# Patient Record
Sex: Male | Born: 1970 | Race: Black or African American | Hispanic: No | Marital: Married | Smoking: Current every day smoker
Health system: Southern US, Community
[De-identification: ages and names within clinical notes are randomized; demographics above are authoritative.]

## PROBLEM LIST (undated history)

## (undated) DIAGNOSIS — G4733 Obstructive sleep apnea (adult) (pediatric): Secondary | ICD-10-CM

## (undated) DIAGNOSIS — L930 Discoid lupus erythematosus: Secondary | ICD-10-CM

## (undated) DIAGNOSIS — F419 Anxiety disorder, unspecified: Secondary | ICD-10-CM

## (undated) DIAGNOSIS — I10 Essential (primary) hypertension: Secondary | ICD-10-CM

## (undated) DIAGNOSIS — E785 Hyperlipidemia, unspecified: Secondary | ICD-10-CM

## (undated) HISTORY — PX: JOINT REPLACEMENT: SHX530

---

## 2017-02-23 DIAGNOSIS — E038 Other specified hypothyroidism: Secondary | ICD-10-CM | POA: Insufficient documentation

## 2017-02-23 DIAGNOSIS — E669 Obesity, unspecified: Secondary | ICD-10-CM | POA: Insufficient documentation

## 2017-02-23 DIAGNOSIS — L930 Discoid lupus erythematosus: Secondary | ICD-10-CM | POA: Insufficient documentation

## 2017-02-23 DIAGNOSIS — G4733 Obstructive sleep apnea (adult) (pediatric): Secondary | ICD-10-CM | POA: Diagnosis present

## 2017-02-23 DIAGNOSIS — I10 Essential (primary) hypertension: Secondary | ICD-10-CM | POA: Insufficient documentation

## 2017-02-23 DIAGNOSIS — I129 Hypertensive chronic kidney disease with stage 1 through stage 4 chronic kidney disease, or unspecified chronic kidney disease: Secondary | ICD-10-CM | POA: Insufficient documentation

## 2017-12-08 ENCOUNTER — Emergency Department: Payer: Self-pay

## 2017-12-08 ENCOUNTER — Other Ambulatory Visit: Payer: Self-pay

## 2017-12-08 ENCOUNTER — Encounter: Payer: Self-pay | Admitting: Emergency Medicine

## 2017-12-08 ENCOUNTER — Observation Stay
Admission: EM | Admit: 2017-12-08 | Discharge: 2017-12-09 | Disposition: A | Discharge status: Home / Self Care | Payer: Self-pay | Attending: Internal Medicine | Admitting: Internal Medicine

## 2017-12-08 DIAGNOSIS — Z9119 Patient's noncompliance with other medical treatment and regimen: Secondary | ICD-10-CM | POA: Insufficient documentation

## 2017-12-08 DIAGNOSIS — N183 Chronic kidney disease, stage 3 (moderate): Secondary | ICD-10-CM | POA: Insufficient documentation

## 2017-12-08 DIAGNOSIS — I10 Essential (primary) hypertension: Secondary | ICD-10-CM | POA: Diagnosis present

## 2017-12-08 DIAGNOSIS — F1290 Cannabis use, unspecified, uncomplicated: Secondary | ICD-10-CM | POA: Insufficient documentation

## 2017-12-08 DIAGNOSIS — I161 Hypertensive emergency: Secondary | ICD-10-CM

## 2017-12-08 DIAGNOSIS — M79604 Pain in right leg: Secondary | ICD-10-CM | POA: Insufficient documentation

## 2017-12-08 DIAGNOSIS — E1122 Type 2 diabetes mellitus with diabetic chronic kidney disease: Secondary | ICD-10-CM | POA: Insufficient documentation

## 2017-12-08 DIAGNOSIS — I252 Old myocardial infarction: Secondary | ICD-10-CM | POA: Insufficient documentation

## 2017-12-08 DIAGNOSIS — L930 Discoid lupus erythematosus: Secondary | ICD-10-CM | POA: Insufficient documentation

## 2017-12-08 DIAGNOSIS — E876 Hypokalemia: Secondary | ICD-10-CM | POA: Insufficient documentation

## 2017-12-08 DIAGNOSIS — R609 Edema, unspecified: Secondary | ICD-10-CM

## 2017-12-08 DIAGNOSIS — I1310 Hypertensive heart and chronic kidney disease without heart failure, with stage 1 through stage 4 chronic kidney disease, or unspecified chronic kidney disease: Principal | ICD-10-CM | POA: Insufficient documentation

## 2017-12-08 DIAGNOSIS — Z6841 Body Mass Index (BMI) 40.0 and over, adult: Secondary | ICD-10-CM | POA: Insufficient documentation

## 2017-12-08 DIAGNOSIS — R079 Chest pain, unspecified: Secondary | ICD-10-CM | POA: Diagnosis present

## 2017-12-08 DIAGNOSIS — E785 Hyperlipidemia, unspecified: Secondary | ICD-10-CM | POA: Diagnosis present

## 2017-12-08 DIAGNOSIS — R0789 Other chest pain: Secondary | ICD-10-CM | POA: Insufficient documentation

## 2017-12-08 DIAGNOSIS — M7989 Other specified soft tissue disorders: Secondary | ICD-10-CM | POA: Insufficient documentation

## 2017-12-08 DIAGNOSIS — F17210 Nicotine dependence, cigarettes, uncomplicated: Secondary | ICD-10-CM | POA: Insufficient documentation

## 2017-12-08 DIAGNOSIS — F419 Anxiety disorder, unspecified: Secondary | ICD-10-CM | POA: Diagnosis present

## 2017-12-08 DIAGNOSIS — G4733 Obstructive sleep apnea (adult) (pediatric): Secondary | ICD-10-CM | POA: Diagnosis present

## 2017-12-08 HISTORY — DX: Discoid lupus erythematosus: L93.0

## 2017-12-08 HISTORY — DX: Obstructive sleep apnea (adult) (pediatric): G47.33

## 2017-12-08 HISTORY — DX: Hyperlipidemia, unspecified: E78.5

## 2017-12-08 HISTORY — DX: Essential (primary) hypertension: I10

## 2017-12-08 HISTORY — DX: Anxiety disorder, unspecified: F41.9

## 2017-12-08 LAB — COMPREHENSIVE METABOLIC PANEL
ALBUMIN: 3.8 g/dL (ref 3.5–5.0)
ALT: 20 U/L (ref 17–63)
ANION GAP: 7 (ref 5–15)
AST: 33 U/L (ref 15–41)
Alkaline Phosphatase: 52 U/L (ref 38–126)
BUN: 24 mg/dL — AB (ref 6–20)
CHLORIDE: 105 mmol/L (ref 101–111)
CO2: 25 mmol/L (ref 22–32)
Calcium: 9 mg/dL (ref 8.9–10.3)
Creatinine, Ser: 1.95 mg/dL — ABNORMAL HIGH (ref 0.61–1.24)
GFR calc Af Amer: 45 mL/min — ABNORMAL LOW (ref >60)
GFR, EST NON AFRICAN AMERICAN: 39 mL/min — AB (ref >60)
Glucose, Bld: 84 mg/dL (ref 65–99)
POTASSIUM: 4.4 mmol/L (ref 3.5–5.1)
Sodium: 137 mmol/L (ref 135–145)
Total Bilirubin: 0.9 mg/dL (ref 0.3–1.2)
Total Protein: 7.3 g/dL (ref 6.5–8.1)

## 2017-12-08 LAB — TROPONIN I
TROPONIN I: 0.03 ng/mL — AB (ref <0.03)
TROPONIN I: 0.03 ng/mL — AB (ref <0.03)

## 2017-12-08 LAB — BRAIN NATRIURETIC PEPTIDE: B Natriuretic Peptide: 93 pg/mL (ref 0.0–100.0)

## 2017-12-08 LAB — CBC
HCT: 45.1 % (ref 40.0–52.0)
Hemoglobin: 15.1 g/dL (ref 13.0–18.0)
MCH: 32.7 pg (ref 26.0–34.0)
MCHC: 33.3 g/dL (ref 32.0–36.0)
MCV: 98.1 fL (ref 80.0–100.0)
PLATELETS: 136 10*3/uL — AB (ref 150–440)
RBC: 4.6 MIL/uL (ref 4.40–5.90)
RDW: 18.2 % — ABNORMAL HIGH (ref 11.5–14.5)
WBC: 5.4 10*3/uL (ref 3.8–10.6)

## 2017-12-08 MED ORDER — CLONIDINE HCL 0.1 MG PO TABS
ORAL_TABLET | ORAL | Status: AC
  Administered 2017-12-08: 0.2 mg via ORAL
  Filled 2017-12-08: qty 2

## 2017-12-08 MED ORDER — HYDRALAZINE HCL 20 MG/ML IJ SOLN
5.0000 mg | Freq: Once | INTRAMUSCULAR | Status: AC
  Administered 2017-12-08: 5 mg via INTRAVENOUS
  Filled 2017-12-08: qty 1

## 2017-12-08 MED ORDER — HYDRALAZINE HCL 20 MG/ML IJ SOLN
5.0000 mg | Freq: Once | INTRAMUSCULAR | Status: AC
  Administered 2017-12-08: 5 mg via INTRAVENOUS

## 2017-12-08 MED ORDER — HYDRALAZINE HCL 20 MG/ML IJ SOLN: 5.0000 mg | Freq: Once | INTRAMUSCULAR | Status: DC

## 2017-12-08 MED ORDER — CLONIDINE HCL 0.1 MG PO TABS
0.2000 mg | ORAL_TABLET | Freq: Once | ORAL | Status: AC
  Administered 2017-12-08: 0.2 mg via ORAL

## 2017-12-08 MED ORDER — ASPIRIN 81 MG PO CHEW
324.0000 mg | CHEWABLE_TABLET | Freq: Once | ORAL | Status: AC
  Administered 2017-12-08: 324 mg via ORAL
  Filled 2017-12-08: qty 4

## 2017-12-08 NOTE — ED Notes (Signed)
Charge nurse notified of pt's troponin 

## 2017-12-08 NOTE — H&P (Addendum)
[facility] [place] [facility]   PATIENT NAME: Oscar Jones    MR#:  [id number]  DATE OF BIRTH:  1971-07-08  DATE OF ADMISSION:  12/08/2017  PRIMARY CARE PHYSICIAN: Patient, No Pcp Per   REQUESTING/REFERRING PHYSICIAN: Fanny BienQuale, MD  CHIEF COMPLAINT:   Chief Complaint  Patient presents with  . Chest Pain    HISTORY OF PRESENT ILLNESS:  Oscar Abbotndre Ruppe  is a 47 y.o. male who presents with chest pain and shortness of breath.  Patient states that he recently moved here from [place].  In [place] he was on a number of medications for blood pressure and some cardiac meds.  He states that he had a workup by cardiologist up there is some months ago and was given medications at that time.  However, he has not been able to get the medicine since he has been down here.  He presented to the ED tonight with shortness of breath and a blood pressure in the 200s over 120s.  His blood pressure did respond to medications initially, but given the chest pain and his risk factors hospitalist were called for admission and further evaluation  PAST MEDICAL HISTORY:   Past Medical History:  Diagnosis Date  . Anxiety   . Discoid lupus   . HLD (hyperlipidemia)   . Hypertension   . OSA (obstructive sleep apnea)     PAST SURGICAL HISTORY:   Past Surgical History:  Procedure Laterality Date  . JOINT REPLACEMENT      SOCIAL HISTORY:   Social History   Tobacco Use  . Smoking status: Current Every Day Smoker    Packs/day: 1.00    Types: Cigarettes  Substance Use Topics  . Alcohol use: Yes    Alcohol/week: 3.6 oz    Types: 6 Cans of beer per week    FAMILY HISTORY:  No family history on file.  DRUG ALLERGIES:  No Known Allergies  MEDICATIONS AT HOME:   Prior to Admission medications   Medication Sig Start Date End Date Taking? Authorizing Provider  acetaminophen (TYLENOL) 325 MG tablet Take 975 mg by mouth 2 (two) times daily.    Yes [provider]   naproxen sodium (ALEVE) 220 MG tablet Take 660 mg by mouth 2 (two) times daily.    Yes [provider]    REVIEW OF SYSTEMS:  Review of Systems  Constitutional: Negative for chills, fever, malaise/fatigue and weight loss.  HENT: Negative for ear pain, hearing loss and tinnitus.   Eyes: Negative for blurred vision, double vision, pain and redness.  Respiratory: Positive for shortness of breath. Negative for cough and hemoptysis.   Cardiovascular: Positive for chest pain and leg swelling. Negative for palpitations and orthopnea.  Gastrointestinal: Negative for abdominal pain, constipation, diarrhea, nausea and vomiting.  Genitourinary: Negative for dysuria, frequency and hematuria.  Musculoskeletal: Negative for back pain, joint pain and neck pain.  Skin:       No acne, rash, or lesions  Neurological: Negative for dizziness, tremors, focal weakness and weakness.  Endo/Heme/Allergies: Negative for polydipsia. Does not bruise/bleed easily.  Psychiatric/Behavioral: Negative for depression. The patient is not nervous/anxious and does not have insomnia.      VITAL SIGNS:   Vitals:   12/08/17 2045 12/08/17 2148 12/08/17 2202 12/08/17 2245  BP: (!) 179/108 (!) 157/96 (!) 174/113 (!) 155/99  Pulse:    68  Resp: 15  18 17   Temp:      TempSrc:  SpO2:   97% 97%  Weight:      Height:       Wt Readings from Last 3 Encounters:  12/08/17 (!) 172.4 kg (380 lb)    PHYSICAL EXAMINATION:  Physical Exam  Vitals reviewed. Constitutional: He is oriented to person, place, and time. He appears well-developed and well-nourished. No distress.  HENT:  Head: Normocephalic and atraumatic.  Mouth/Throat: Oropharynx is clear and moist.  Eyes: Conjunctivae and EOM are normal. Pupils are equal, round, and reactive to light. No scleral icterus.  Neck: Normal range of motion. Neck supple. No JVD present. No thyromegaly present.  Cardiovascular: Normal rate, regular rhythm and intact distal  pulses. Exam reveals no gallop and no friction rub.  No murmur heard. Respiratory: Effort normal and breath sounds normal. No respiratory distress. He has no wheezes. He has no rales.  GI: Soft. Bowel sounds are normal. He exhibits no distension. There is no tenderness.  Musculoskeletal: Normal range of motion. He exhibits edema.  No arthritis, no gout  Lymphadenopathy:    He has no cervical adenopathy.  Neurological: He is alert and oriented to person, place, and time. No cranial nerve deficit.  No dysarthria, no aphasia  Skin: Skin is warm and dry. No rash noted. No erythema.  Psychiatric: He has a normal mood and affect. His behavior is normal. Judgment and thought content normal.    LABORATORY PANEL:   CBC Recent Labs  Lab 12/08/17 1715  WBC 5.4  HGB 15.1  HCT 45.1  PLT 136*   ------------------------------------------------------------------------------------------------------------------  Chemistries  Recent Labs  Lab 12/08/17 1715  NA 137  K 4.4  CL 105  CO2 25  GLUCOSE 84  BUN 24*  CREATININE 1.95*  CALCIUM 9.0  AST 33  ALT 20  ALKPHOS 52  BILITOT 0.9   ------------------------------------------------------------------------------------------------------------------  Cardiac Enzymes Recent Labs  Lab 12/08/17 2047  TROPONINI 0.03*   ------------------------------------------------------------------------------------------------------------------  RADIOLOGY:  Dg Chest 2 View  Result Date: 12/08/2017 CLINICAL DATA:  Intermittent chest pain for 2 weeks. EXAM: CHEST  2 VIEW COMPARISON:  None. FINDINGS: The mediastinal contour is normal and heart size is enlarged. Aorta is tortuous. Biapical scarring are noted. Mild patchy opacity of right lung base is identified. There is no pulmonary edema or pleural effusion. The visualized skeletal structures are unremarkable. IMPRESSION: Mild patchy opacity of right lung base which could reflect atelectasis or early  pneumonia. Electronically Signed   By: Sherian Rein M.D.   On: 12/08/2017 20:18    EKG:   Orders placed or performed during the hospital encounter of 12/08/17  . EKG 12-Lead  . EKG 12-Lead  . ED EKG within 10 minutes  . ED EKG within 10 minutes    IMPRESSION AND PLAN:  Principal Problem:   Accelerated hypertension -patient responded to IV antihypertensives.  We will admit him with monitoring continued blood pressure control with a goal less than 160/100. Active Problems:   Chest pain -cycle cardiac enzymes, get an echocardiogram and cardiology consult.  Patient also complains of pain in his right leg and swelling in that leg more so than on the left.  We will ultrasound his leg to evaluate for DVT   OSA treated with BiPAP -BiPAP nightly   HLD (hyperlipidemia) -we will check his lipids, he will likely need to be started on a statin.  Of note, patient states he is not connected with any physicians in this area.  He would benefit from being connected to primary care physician in  addition to his cardiology consult above.  All the records are reviewed and case discussed with ED provider. Management plans discussed with the patient and/or family.  DVT PROPHYLAXIS: SubQ lovenox  GI PROPHYLAXIS: None  ADMISSION STATUS: Observation  CODE STATUS: Full Code Status History    This patient does not have a recorded code status. Please follow your organizational policy for patients in this situation.      TOTAL TIME TAKING CARE OF THIS PATIENT: 40 minutes.   Mamta Rimmer FIELDING 12/08/2017, 11:50 PM  [facility]  Office  [phone]  CC: Primary care physician; Patient, No Pcp Per  Note:  This document was prepared using Dragon voice recognition software and may include unintentional dictation errors.

## 2017-12-08 NOTE — ED Provider Notes (Signed)
[facility] Emergency Department Provider Note   ____________________________________________   First MD Initiated Contact with Patient 12/08/17 1938     (approximate)  I have reviewed the triage vital signs and the nursing notes.   HISTORY  Chief Complaint Chest Pain   HPI Oscar Jones is a 47 y.o. male presents for evaluation of chest discomfort and shortness of breath  Patient reports that he has been off of his blood pressure medicine for about 1-2 months now at least, reports he has a history of very high blood pressure.  He is noticed that his legs have become swollen, he is feeling short of breath when he walks or exerts himself, he is also having intermittent discomfort in the middle of his chest which she describes as a slight heaviness.  Reports that this has happened to him before and was treated in [place] and thought due to his severe high blood pressure for which she is supposed to be taking several medications which he cannot recall the names of except thinks he may have been taking clonidine.  Also reports is a history of chronic kidney disease this  Reports that he needs to get back on his blood pressure medicine.  Currently reports his chest discomfort is improved slightly after taking blood pressure medicine when he arrived to the ER  Past Medical History:  Diagnosis Date  . Discoid lupus   . Hypertension     There are no active problems to display for this patient.   History reviewed. No pertinent surgical history.  Prior to Admission medications   Not on File    Allergies Patient has no known allergies.  No family history on file.  Social History Social History   Tobacco Use  . Smoking status: Current Every Day Smoker    Packs/day: 1.00    Types: Cigarettes  Substance Use Topics  . Alcohol use: Not on file  . Drug use: Not on file    Review of Systems Constitutional: No fever/chills Eyes: No visual  changes. ENT: No sore throat. Cardiovascular: See HPI respiratory: Mild shortness of breath with walking for about the last 2 weeks, is worse when he lays down at night.  He denies productive cough. Gastrointestinal: No abdominal pain.  No nausea, no vomiting.  No diarrhea.  No constipation. Genitourinary: Negative for dysuria. Musculoskeletal: Negative for back pain.  For the last month or 2 some swelling in his lower legs bilateral Skin: Negative for rash. Neurological: Negative for headaches, focal weakness or numbness.    ____________________________________________   PHYSICAL EXAM:  VITAL SIGNS: ED Triage Vitals  Enc Vitals Group     BP 12/08/17 1713 (!) 216/126     Pulse Rate 12/08/17 1713 69     Resp 12/08/17 1713 20     Temp 12/08/17 1713 97.9 F (36.6 C)     Temp Source 12/08/17 1713 Oral     SpO2 12/08/17 1713 98 %     Weight 12/08/17 1717 (!) 380 lb (172.4 kg)     Height 12/08/17 1717 6' (1.829 m)     Head Circumference --      Peak Flow --      Pain Score --      Pain Loc --      Pain Edu? --      Excl. in GC? --     Constitutional: Alert and oriented. Well appearing and in no acute distress. Eyes: Conjunctivae are normal. Head: Atraumatic. Nose: No  congestion/rhinnorhea. Mouth/Throat: Mucous membranes are moist. Neck: No stridor.   Cardiovascular: Normal rate, regular rhythm. Grossly normal heart sounds.  Good peripheral circulation.  Mild JVD is noted. Respiratory: Normal respiratory effort.  No retractions. Lungs CTAB. Gastrointestinal: Soft and nontender. No distention. Musculoskeletal: Patient has 2+ pitting edema in the lower extremities bilateral.  Full range of motion of all extremities without pain or discomfort. Neurologic:  Normal speech and language. No gross focal neurologic deficits are appreciated.  Skin:  Skin is warm, dry and intact. No rash noted. Psychiatric: Mood and affect are normal. Speech and behavior are  normal.  ____________________________________________   LABS (all labs ordered are listed, but only abnormal results are displayed)  Labs Reviewed  CBC - Abnormal; Notable for the following components:      Result Value   RDW 18.2 (*)    Platelets 136 (*)    All other components within normal limits  TROPONIN I - Abnormal; Notable for the following components:   Troponin I 0.03 (*)    All other components within normal limits  COMPREHENSIVE METABOLIC PANEL - Abnormal; Notable for the following components:   BUN 24 (*)    Creatinine, Ser 1.95 (*)    GFR calc non Af Amer 39 (*)    GFR calc Af Amer 45 (*)    All other components within normal limits  TROPONIN I - Abnormal; Notable for the following components:   Troponin I 0.03 (*)    All other components within normal limits  BRAIN NATRIURETIC PEPTIDE   ____________________________________________  EKG  Reviewed interrupt me at 1715 Ventricular rate 75 QRS 75 QTC 470 Normal sinus rhythm, a very minimal nonspecific T wave abnormal V5 and V6.  LVH.  No obvious ischemic changes are noted ____________________________________________  RADIOLOGY  Dg Chest 2 View  Result Date: 12/08/2017 CLINICAL DATA:  Intermittent chest pain for 2 weeks. EXAM: CHEST  2 VIEW COMPARISON:  None. FINDINGS: The mediastinal contour is normal and heart size is enlarged. Aorta is tortuous. Biapical scarring are noted. Mild patchy opacity of right lung base is identified. There is no pulmonary edema or pleural effusion. The visualized skeletal structures are unremarkable. IMPRESSION: Mild patchy opacity of right lung base which could reflect atelectasis or early pneumonia. Electronically Signed   By: Sherian Rein M.D.   On: 12/08/2017 20:18    Chest x-ray reviewed, slight lung base opacity reviewed by the radiologist.  Clinically, denies fevers or chills or systemic symptoms suggest infection.  I suspect this is likely atelectasis and unlikely to  represent pneumonia given he has no fevers or chills, denies associated cough or right-sided chest discomfort.  ____________________________________________   PROCEDURES  Procedure(s) performed: None  Procedures  Critical Care performed: No  ____________________________________________   INITIAL IMPRESSION / ASSESSMENT AND PLAN / ED COURSE  Pertinent labs & imaging results that were available during my care of the patient were reviewed by me and considered in my medical decision making (see chart for details).       ----------------------------------------- 9:44 PM on 12/08/2017 -----------------------------------------  Blood pressure is up to 118/119 now, despite hydralazine and clonidine.  Did improve briefly with hydralazine.  I will give a second dose of hydralazine at this time, patient reports his chest discomfort has improved, but he obviously has severe untreated hypertension now with elevation of his troponin and I suspect likely some worsening of his renal disease concerning for hypertensive urgency/borderline hypertensive emergency today.  Patient agreeable with plan for admission.  Patient will be admitted, repeat troponin remains minimally elevated.  No significant change however.  Reports symptoms of chest pain are improving.  No associated neurologic symptoms. ____________________________________________   FINAL CLINICAL IMPRESSION(S) / ED DIAGNOSES  Final diagnoses:  Hypertensive emergency without congestive heart failure      NEW MEDICATIONS STARTED DURING THIS VISIT:  This SmartLink is deprecated. Use AVSMEDLIST instead to display the medication list for a patient.   Note:  This document was prepared using Dragon voice recognition software and may include unintentional dictation errors.     Sharyn Creamer, MD 12/08/17 2146

## 2017-12-08 NOTE — ED Triage Notes (Addendum)
Intermittent chest pain x 2 weeks. Associated with SOB. Does not increase with inspiration. Regarding blood pressure, states has history of hypertension and has not taken meds x 2 weeks.

## 2017-12-08 NOTE — ED Notes (Signed)
Pt sleeping.  Respirations even and non-labored.

## 2017-12-09 ENCOUNTER — Observation Stay (HOSPITAL_BASED_OUTPATIENT_CLINIC_OR_DEPARTMENT_OTHER)
Admit: 2017-12-09 | Discharge: 2017-12-09 | Disposition: A | Discharge status: Home / Self Care | Payer: Self-pay | Attending: Internal Medicine | Admitting: Internal Medicine

## 2017-12-09 ENCOUNTER — Other Ambulatory Visit: Payer: Self-pay

## 2017-12-09 ENCOUNTER — Observation Stay (HOSPITAL_BASED_OUTPATIENT_CLINIC_OR_DEPARTMENT_OTHER): Payer: Self-pay

## 2017-12-09 ENCOUNTER — Observation Stay: Payer: Self-pay

## 2017-12-09 ENCOUNTER — Encounter: Payer: Self-pay | Admitting: Emergency Medicine

## 2017-12-09 DIAGNOSIS — I119 Hypertensive heart disease without heart failure: Secondary | ICD-10-CM

## 2017-12-09 DIAGNOSIS — I10 Essential (primary) hypertension: Secondary | ICD-10-CM

## 2017-12-09 DIAGNOSIS — I340 Nonrheumatic mitral (valve) insufficiency: Secondary | ICD-10-CM

## 2017-12-09 DIAGNOSIS — R079 Chest pain, unspecified: Secondary | ICD-10-CM

## 2017-12-09 DIAGNOSIS — G4733 Obstructive sleep apnea (adult) (pediatric): Secondary | ICD-10-CM

## 2017-12-09 LAB — LIPID PANEL
CHOL/HDL RATIO: 4.4 ratio
CHOLESTEROL: 304 mg/dL — AB (ref 0–200)
HDL: 69 mg/dL (ref >40)
LDL Cholesterol: 196 mg/dL — ABNORMAL HIGH (ref 0–99)
TRIGLYCERIDES: 196 mg/dL — AB (ref <150)
VLDL: 39 mg/dL (ref 0–40)

## 2017-12-09 LAB — CBC
HEMATOCRIT: 43.3 % (ref 40.0–52.0)
HEMOGLOBIN: 14.7 g/dL (ref 13.0–18.0)
MCH: 33.1 pg (ref 26.0–34.0)
MCHC: 34 g/dL (ref 32.0–36.0)
MCV: 97.2 fL (ref 80.0–100.0)
Platelets: 141 10*3/uL — ABNORMAL LOW (ref 150–440)
RBC: 4.45 MIL/uL (ref 4.40–5.90)
RDW: 18 % — ABNORMAL HIGH (ref 11.5–14.5)
WBC: 4.6 10*3/uL (ref 3.8–10.6)

## 2017-12-09 LAB — NM MYOCAR MULTI W/SPECT W/WALL MOTION / EF
Peak HR: 81 {beats}/min
Percent HR: 47 %
Rest HR: 50 {beats}/min

## 2017-12-09 LAB — MAGNESIUM: MAGNESIUM: 1.9 mg/dL (ref 1.7–2.4)

## 2017-12-09 LAB — BASIC METABOLIC PANEL
ANION GAP: 8 (ref 5–15)
BUN: 22 mg/dL — ABNORMAL HIGH (ref 6–20)
CHLORIDE: 106 mmol/L (ref 101–111)
CO2: 24 mmol/L (ref 22–32)
Calcium: 8.9 mg/dL (ref 8.9–10.3)
Creatinine, Ser: 1.86 mg/dL — ABNORMAL HIGH (ref 0.61–1.24)
GFR calc non Af Amer: 41 mL/min — ABNORMAL LOW (ref >60)
GFR, EST AFRICAN AMERICAN: 48 mL/min — AB (ref >60)
Glucose, Bld: 116 mg/dL — ABNORMAL HIGH (ref 65–99)
POTASSIUM: 3.3 mmol/L — AB (ref 3.5–5.1)
SODIUM: 138 mmol/L (ref 135–145)

## 2017-12-09 LAB — TROPONIN I
Troponin I: <0.03 ng/mL (ref <0.03)
Troponin I: 0.03 ng/mL (ref <0.03)
Troponin I: 0.03 ng/mL (ref <0.03)

## 2017-12-09 LAB — ECHOCARDIOGRAM COMPLETE
Height: 72 in
Weight: 5209.6 oz

## 2017-12-09 MED ORDER — LABETALOL HCL 5 MG/ML IV SOLN: 10.0000 mg | INTRAVENOUS | Status: DC | PRN

## 2017-12-09 MED ORDER — AMLODIPINE BESYLATE 5 MG PO TABS
5.0000 mg | ORAL_TABLET | Freq: Every day | ORAL | Status: DC
  Administered 2017-12-09: 5 mg via ORAL
  Filled 2017-12-09: qty 1

## 2017-12-09 MED ORDER — ONDANSETRON HCL 4 MG PO TABS: 4.0000 mg | ORAL_TABLET | Freq: Four times a day (QID) | ORAL | Status: DC | PRN

## 2017-12-09 MED ORDER — ISOSORBIDE MONONITRATE ER 30 MG PO TB24
30.0000 mg | ORAL_TABLET | Freq: Every day | ORAL | Status: DC
  Administered 2017-12-09: 30 mg via ORAL
  Filled 2017-12-09: qty 1

## 2017-12-09 MED ORDER — HYDRALAZINE HCL 50 MG PO TABS
50.0000 mg | ORAL_TABLET | Freq: Three times a day (TID) | ORAL | Status: DC
  Administered 2017-12-09 (×2): 50 mg via ORAL
  Filled 2017-12-09 (×2): qty 1

## 2017-12-09 MED ORDER — TRAMADOL HCL 50 MG PO TABS
50.0000 mg | ORAL_TABLET | Freq: Four times a day (QID) | ORAL | Status: DC | PRN
  Administered 2017-12-09 (×3): 50 mg via ORAL
  Filled 2017-12-09 (×3): qty 1

## 2017-12-09 MED ORDER — HYDRALAZINE HCL 50 MG PO TABS: 50.0000 mg | ORAL_TABLET | Freq: Three times a day (TID) | ORAL | 1 refills | Status: DC

## 2017-12-09 MED ORDER — REGADENOSON 0.4 MG/5ML IV SOLN
0.4000 mg | Freq: Once | INTRAVENOUS | Status: AC
  Administered 2017-12-09: 0.4 mg via INTRAVENOUS

## 2017-12-09 MED ORDER — TECHNETIUM TC 99M TETROFOSMIN IV KIT
30.0000 | PACK | Freq: Once | INTRAVENOUS | Status: AC | PRN
  Administered 2017-12-09: 32.67 via INTRAVENOUS

## 2017-12-09 MED ORDER — ACETAMINOPHEN 650 MG RE SUPP: 650.0000 mg | Freq: Four times a day (QID) | RECTAL | Status: DC | PRN

## 2017-12-09 MED ORDER — NITROGLYCERIN 0.4 MG SL SUBL: 0.4000 mg | SUBLINGUAL_TABLET | SUBLINGUAL | Status: DC | PRN

## 2017-12-09 MED ORDER — ISOSORBIDE MONONITRATE ER 30 MG PO TB24: 30.0000 mg | ORAL_TABLET | Freq: Every day | ORAL | 1 refills | Status: DC

## 2017-12-09 MED ORDER — ACETAMINOPHEN 325 MG PO TABS
650.0000 mg | ORAL_TABLET | Freq: Four times a day (QID) | ORAL | Status: DC | PRN
  Administered 2017-12-09: 650 mg via ORAL
  Filled 2017-12-09: qty 2

## 2017-12-09 MED ORDER — POTASSIUM CHLORIDE CRYS ER 20 MEQ PO TBCR
40.0000 meq | EXTENDED_RELEASE_TABLET | Freq: Once | ORAL | Status: AC
  Administered 2017-12-09: 40 meq via ORAL
  Filled 2017-12-09: qty 2

## 2017-12-09 MED ORDER — ATORVASTATIN CALCIUM 20 MG PO TABS
40.0000 mg | ORAL_TABLET | Freq: Every day | ORAL | Status: DC
  Administered 2017-12-09: 40 mg via ORAL
  Filled 2017-12-09: qty 2

## 2017-12-09 MED ORDER — ONDANSETRON HCL 4 MG/2ML IJ SOLN: 4.0000 mg | Freq: Four times a day (QID) | INTRAMUSCULAR | Status: DC | PRN

## 2017-12-09 MED ORDER — HYDRALAZINE HCL 20 MG/ML IJ SOLN
10.0000 mg | INTRAMUSCULAR | Status: DC | PRN
  Administered 2017-12-09: 10 mg via INTRAVENOUS
  Filled 2017-12-09: qty 1

## 2017-12-09 MED ORDER — ATORVASTATIN CALCIUM 40 MG PO TABS: 40.0000 mg | ORAL_TABLET | Freq: Every day | ORAL | 1 refills | Status: DC

## 2017-12-09 MED ORDER — AMLODIPINE BESYLATE 5 MG PO TABS: 5.0000 mg | ORAL_TABLET | Freq: Every day | ORAL | 1 refills | Status: DC

## 2017-12-09 MED ORDER — HEPARIN SODIUM (PORCINE) 5000 UNIT/ML IJ SOLN
5000.0000 [IU] | Freq: Three times a day (TID) | INTRAMUSCULAR | Status: DC
  Administered 2017-12-09 (×3): 5000 [IU] via SUBCUTANEOUS
  Filled 2017-12-09 (×3): qty 1

## 2017-12-09 NOTE — Progress Notes (Signed)
*  PRELIMINARY RESULTS* Echocardiogram 2D Echocardiogram has been performed.  Cristela BlueHege, Nicolaus Andel 12/09/2017, 8:41 AM

## 2017-12-09 NOTE — Progress Notes (Signed)
CRITICAL VALUE ALERT  Critical Value:  Troponin 0.03  Date & Time Notied:  12/09/2017 0758  Provider Notified: Cardio-Ryan Dunn   Orders Received/Actions taken: No new orders,MD aware

## 2017-12-09 NOTE — ED Notes (Signed)
Patient transported to 250 

## 2017-12-09 NOTE — Discharge Summary (Addendum)
[facility] - [place] at [facility]   PATIENT NAME: Oscar Jones    MR#:  [id number]  DATE OF BIRTH:  07/27/1971  DATE OF ADMISSION:  12/08/2017   ADMITTING PHYSICIAN: Oralia Manis, MD  DATE OF DISCHARGE: 12/09/2017 PRIMARY CARE PHYSICIAN: Patient, No Pcp Per   ADMISSION DIAGNOSIS:  Hypertensive emergency without congestive heart failure [I16.1] Moderate risk chest pain [R07.9] DISCHARGE DIAGNOSIS:  Principal Problem:   Accelerated hypertension Active Problems:   Chest pain   OSA treated with BiPAP   Anxiety   HLD (hyperlipidemia)  SECONDARY DIAGNOSIS:   Past Medical History:  Diagnosis Date  . Anxiety   . Discoid lupus   . HLD (hyperlipidemia)   . Hypertension   . OSA (obstructive sleep apnea)    HOSPITAL COURSE:  Accelerated hypertension, improved. Continue Norvasc, hydralazine and added imdur, IV hydralazine as needed.  Chest pain Troponin is normal, echocardiogram show normal ejection fraction LV EF: 55% - 60%. Venous duplex did not show any DVT.  Normal stress test per Dr. Mariah Milling.  Hypokalemia.  Potassium supplement.  Magnesium is normal.  Morbid obesity and OSA. On BiPAP nightly HLD (hyperlipidemia). LDL 196.  Started Lipitor. CKD stage III.  Stable.  Tobacco abuse.  Smoking cessation was counseled for 4 minutes, nicotine patch. DISCHARGE CONDITIONS:  Stable, discharged to home today. CONSULTS OBTAINED:  Treatment Team:  Antonieta Iba, MD DRUG ALLERGIES:  No Known Allergies DISCHARGE MEDICATIONS:   Allergies as of 12/09/2017   No Known Allergies     Medication List    STOP taking these medications   naproxen sodium 220 MG tablet Commonly known as:  ALEVE     TAKE these medications   acetaminophen 325 MG tablet Commonly known as:  TYLENOL Take 975 mg by mouth 2 (two) times daily.   amLODipine 5 MG tablet Commonly known as:  NORVASC Take 1 tablet (5 mg total) by mouth daily. Start taking on:  12/10/2017     atorvastatin 40 MG tablet Commonly known as:  LIPITOR Take 1 tablet (40 mg total) by mouth daily at 6 PM.   hydrALAZINE 50 MG tablet Commonly known as:  APRESOLINE Take 1 tablet (50 mg total) by mouth every 8 (eight) hours.   isosorbide mononitrate 30 MG 24 hr tablet Commonly known as:  IMDUR Take 1 tablet (30 mg total) by mouth daily. Start taking on:  12/10/2017        DISCHARGE INSTRUCTIONS:  See AVS. If you experience worsening of your admission symptoms, develop shortness of breath, life threatening emergency, suicidal or homicidal thoughts you must seek medical attention immediately by calling 911 or calling your MD immediately  if symptoms less severe.  You Must read complete instructions/literature along with all the possible adverse reactions/side effects for all the Medicines you take and that have been prescribed to you. Take any new Medicines after you have completely understood and accpet all the possible adverse reactions/side effects.   Please note  You were cared for by a hospitalist during your hospital stay. If you have any questions about your discharge medications or the care you received while you were in the hospital after you are discharged, you can call the unit and asked to speak with the hospitalist on call if the hospitalist that took care of you is not available. Once you are discharged, your primary care physician will handle any further medical issues. Please note that NO REFILLS for any discharge medications will be authorized once you  are discharged, as it is imperative that you return to your primary care physician (or establish a relationship with a primary care physician if you do not have one) for your aftercare needs so that they can reassess your need for medications and monitor your lab values.    On the day of Discharge:  VITAL SIGNS:  Blood pressure 133/71, pulse (!) 51, temperature (!) 97.5 F (36.4 C), temperature source Oral, resp. rate 18,  height 6' (1.829 m), weight (!) 325 lb 9.6 oz (147.7 kg), SpO2 97 %. PHYSICAL EXAMINATION:  GENERAL:  47 y.o.-year-old patient lying in the bed with no acute distress.  Morbid obesity. EYES: Pupils equal, round, reactive to light and accommodation. No scleral icterus. Extraocular muscles intact.  HEENT: Head atraumatic, normocephalic. Oropharynx and nasopharynx clear.  NECK:  Supple, no jugular venous distention. No thyroid enlargement, no tenderness.  LUNGS: Normal breath sounds bilaterally, no wheezing, rales,rhonchi or crepitation. No use of accessory muscles of respiration.  CARDIOVASCULAR: S1, S2 normal. No murmurs, rubs, or gallops.  ABDOMEN: Soft, non-tender, non-distended. Bowel sounds present. No organomegaly or mass.  EXTREMITIES: No pedal edema, cyanosis, or clubbing.  NEUROLOGIC: Cranial nerves II through XII are intact. Muscle strength 5/5 in all extremities. Sensation intact. Gait not checked.  PSYCHIATRIC: The patient is alert and oriented x 3.  SKIN: No obvious rash, lesion, or ulcer.  DATA REVIEW:   CBC Recent Labs  Lab 12/09/17 0225  WBC 4.6  HGB 14.7  HCT 43.3  PLT 141*    Chemistries  Recent Labs  Lab 12/08/17 1715 12/09/17 0225 12/09/17 1233  NA 137 138  --   K 4.4 3.3*  --   CL 105 106  --   CO2 25 24  --   GLUCOSE 84 116*  --   BUN 24* 22*  --   CREATININE 1.95* 1.86*  --   CALCIUM 9.0 8.9  --   MG  --   --  1.9  AST 33  --   --   ALT 20  --   --   ALKPHOS 52  --   --   BILITOT 0.9  --   --      Microbiology Results  No results found for this or any previous visit.  RADIOLOGY:  Dg Chest 2 View  Result Date: 12/08/2017 CLINICAL DATA:  Intermittent chest pain for 2 weeks. EXAM: CHEST  2 VIEW COMPARISON:  None. FINDINGS: The mediastinal contour is normal and heart size is enlarged. Aorta is tortuous. Biapical scarring are noted. Mild patchy opacity of right lung base is identified. There is no pulmonary edema or pleural effusion. The visualized  skeletal structures are unremarkable. IMPRESSION: Mild patchy opacity of right lung base which could reflect atelectasis or early pneumonia. Electronically Signed   By: Sherian ReinWei-Ramyah Pankowski  Lin M.D.   On: 12/08/2017 20:18   [place] Venous Img Lower Unilateral Right  Result Date: 12/09/2017 CLINICAL DATA:  47 year old male with a history of swelling EXAM: RIGHT LOWER EXTREMITY VENOUS DOPPLER ULTRASOUND TECHNIQUE: Gray-scale sonography with graded compression, as well as color Doppler and duplex ultrasound were performed to evaluate the lower extremity deep venous systems from the level of the common femoral vein and including the common femoral, femoral, profunda femoral, popliteal and calf veins including the posterior tibial, peroneal and gastrocnemius veins when visible. The superficial great saphenous vein was also interrogated. Spectral Doppler was utilized to evaluate flow at rest and with distal augmentation maneuvers in the common femoral, femoral  and popliteal veins. COMPARISON:  None. FINDINGS: Contralateral Common Femoral Vein: Respiratory phasicity is normal and symmetric with the symptomatic side. No evidence of thrombus. Normal compressibility. Common Femoral Vein: No evidence of thrombus. Normal compressibility, respiratory phasicity and response to augmentation. Saphenofemoral Junction: No evidence of thrombus. Normal compressibility and flow on color Doppler imaging. Profunda Femoral Vein: No evidence of thrombus. Normal compressibility and flow on color Doppler imaging. Femoral Vein: No evidence of thrombus. Normal compressibility, respiratory phasicity and response to augmentation. Popliteal Vein: No evidence of thrombus. Normal compressibility, respiratory phasicity and response to augmentation. Calf Veins: No evidence of thrombus. Normal compressibility and flow on color Doppler imaging. Superficial Great Saphenous Vein: No evidence of thrombus. Normal compressibility and flow on color Doppler imaging. Other  Findings:  None. IMPRESSION: Sonographic survey of the right lower extremity negative for DVT Electronically Signed   By: Gilmer Mor D.O.   On: 12/09/2017 09:12     Management plans discussed with the patient, family and they are in agreement.  CODE STATUS: Full Code   TOTAL TIME TAKING CARE OF THIS PATIENT: 35 minutes.    Shaune Pollack M.D on 12/09/2017 at 5:29 PM  Between 7am to 6pm - Pager - [phone]  After 6pm go to [email or web address] - Social research officer, government  [facility] [place] [facility]  Office  [phone]  CC: Primary care physician; Patient, No Pcp Per   Note: This dictation was prepared with Dragon dictation along with smaller phrase technology. Any transcriptional errors that result from this process are unintentional.

## 2017-12-09 NOTE — Progress Notes (Signed)
Stress test  No ischemia Normal perfusion Resting study is not indicated Patient can be discharged from a cardiac perspective  Signed, Dossie Arbourim Josean Lycan, MD, Ph.D [facility]

## 2017-12-09 NOTE — Progress Notes (Addendum)
[facility] - [place] at [facility]   PATIENT NAME: Oscar Jones    MR#:  [id number]  DATE OF BIRTH:  03-12-1971  SUBJECTIVE:  CHIEF COMPLAINT:   Chief Complaint  Patient presents with  . Chest Pain   No chest pain. REVIEW OF SYSTEMS:  Review of Systems  Constitutional: Negative for chills, fever and malaise/fatigue.  HENT: Negative for sore throat.   Eyes: Negative for blurred vision and double vision.  Respiratory: Negative for cough, hemoptysis, shortness of breath, wheezing and stridor.   Cardiovascular: Negative for chest pain, palpitations, orthopnea and leg swelling.  Gastrointestinal: Negative for abdominal pain, blood in stool, diarrhea, melena, nausea and vomiting.  Genitourinary: Negative for dysuria, flank pain and hematuria.  Musculoskeletal: Negative for back pain and joint pain.  Neurological: Negative for dizziness, sensory change, focal weakness, seizures, loss of consciousness, weakness and headaches.  Endo/Heme/Allergies: Negative for polydipsia.  Psychiatric/Behavioral: Negative for depression. The patient is not nervous/anxious.     DRUG ALLERGIES:  No Known Allergies VITALS:  Blood pressure 133/71, pulse (!) 51, temperature (!) 97.5 F (36.4 C), temperature source Oral, resp. rate 18, height 6' (1.829 m), weight (!) 325 lb 9.6 oz (147.7 kg), SpO2 97 %. PHYSICAL EXAMINATION:  Physical Exam  Constitutional: He is oriented to person, place, and time.  Morbid obesity.  HENT:  Head: Normocephalic.  Mouth/Throat: Oropharynx is clear and moist.  Eyes: Conjunctivae and EOM are normal. Pupils are equal, round, and reactive to light. No scleral icterus.  Neck: Normal range of motion. Neck supple. No JVD present. No tracheal deviation present.  Cardiovascular: Normal rate, regular rhythm and normal heart sounds. Exam reveals no gallop.  No murmur heard. Pulmonary/Chest: Effort normal and breath sounds normal. No respiratory distress. He  has no wheezes. He has no rales.  Abdominal: Soft. Bowel sounds are normal. He exhibits no distension. There is no tenderness. There is no rebound.  Musculoskeletal: Normal range of motion. He exhibits no edema or tenderness.  Neurological: He is alert and oriented to person, place, and time. No cranial nerve deficit.  Skin: No rash noted. No erythema.  Psychiatric: Affect normal.   LABORATORY PANEL:  Male CBC Recent Labs  Lab 12/09/17 0225  WBC 4.6  HGB 14.7  HCT 43.3  PLT 141*   ------------------------------------------------------------------------------------------------------------------ Chemistries  Recent Labs  Lab 12/08/17 1715 12/09/17 0225 12/09/17 1233  NA 137 138  --   K 4.4 3.3*  --   CL 105 106  --   CO2 25 24  --   GLUCOSE 84 116*  --   BUN 24* 22*  --   CREATININE 1.95* 1.86*  --   CALCIUM 9.0 8.9  --   MG  --   --  1.9  AST 33  --   --   ALT 20  --   --   ALKPHOS 52  --   --   BILITOT 0.9  --   --    RADIOLOGY:  Dg Chest 2 View  Result Date: 12/08/2017 CLINICAL DATA:  Intermittent chest pain for 2 weeks. EXAM: CHEST  2 VIEW COMPARISON:  None. FINDINGS: The mediastinal contour is normal and heart size is enlarged. Aorta is tortuous. Biapical scarring are noted. Mild patchy opacity of right lung base is identified. There is no pulmonary edema or pleural effusion. The visualized skeletal structures are unremarkable. IMPRESSION: Mild patchy opacity of right lung base which could reflect atelectasis or early pneumonia. Electronically Signed  By: Sherian ReinWei-Gunnard Dorrance  Lin M.D.   On: 12/08/2017 20:18   [place] Venous Img Lower Unilateral Right  Result Date: 12/09/2017 CLINICAL DATA:  47 year old male with a history of swelling EXAM: RIGHT LOWER EXTREMITY VENOUS DOPPLER ULTRASOUND TECHNIQUE: Gray-scale sonography with graded compression, as well as color Doppler and duplex ultrasound were performed to evaluate the lower extremity deep venous systems from the level of the common  femoral vein and including the common femoral, femoral, profunda femoral, popliteal and calf veins including the posterior tibial, peroneal and gastrocnemius veins when visible. The superficial great saphenous vein was also interrogated. Spectral Doppler was utilized to evaluate flow at rest and with distal augmentation maneuvers in the common femoral, femoral and popliteal veins. COMPARISON:  None. FINDINGS: Contralateral Common Femoral Vein: Respiratory phasicity is normal and symmetric with the symptomatic side. No evidence of thrombus. Normal compressibility. Common Femoral Vein: No evidence of thrombus. Normal compressibility, respiratory phasicity and response to augmentation. Saphenofemoral Junction: No evidence of thrombus. Normal compressibility and flow on color Doppler imaging. Profunda Femoral Vein: No evidence of thrombus. Normal compressibility and flow on color Doppler imaging. Femoral Vein: No evidence of thrombus. Normal compressibility, respiratory phasicity and response to augmentation. Popliteal Vein: No evidence of thrombus. Normal compressibility, respiratory phasicity and response to augmentation. Calf Veins: No evidence of thrombus. Normal compressibility and flow on color Doppler imaging. Superficial Great Saphenous Vein: No evidence of thrombus. Normal compressibility and flow on color Doppler imaging. Other Findings:  None. IMPRESSION: Sonographic survey of the right lower extremity negative for DVT Electronically Signed   By: Gilmer MorJaime  Wagner D.O.   On: 12/09/2017 09:12   ASSESSMENT AND PLAN:    Accelerated hypertension  Continue Norvasc, hydralazine and added imdur, IV hydralazine as needed.  Chest pain Troponin is normal, echocardiogram show normal ejection fraction LV EF: 55% -   60%. Venous duplex did not show any DVT.  Pending stress test.  Hypokalemia.  Potassium supplement.  Magnesium is normal.  Morbid obesity and OSA. On BiPAP nightly HLD (hyperlipidemia). LDL 196.   Started Lipitor. CKD stage III.  Stable.  Tobacco abuse.  Smoking cessation was counseled for 4 minutes, nicotine patch.  All the records are reviewed and case discussed with Care Management/Social Worker. Management plans discussed with the patient, family and they are in agreement.  CODE STATUS: Full Code  TOTAL TIME TAKING CARE OF THIS PATIENT: 33 minutes.   More than 50% of the time was spent in counseling/coordination of care: YES  POSSIBLE D/C IN 1 DAYS, DEPENDING ON CLINICAL CONDITION.   Shaune PollackQing Janyah Singleterry M.D on 12/09/2017 at 4:37 PM  Between 7am to 6pm - Pager - [phone]  After 6pm go to [email or web address] - Therapist, [email or web address] [email or web address]  [facility] [place] [facility]

## 2017-12-09 NOTE — Discharge Instructions (Signed)
Heart healthy diet. °Smoking cessation. °

## 2017-12-09 NOTE — Consult Note (Signed)
Cardiology Consultation:   Patient ID: Oscar Jones; [id number]; 09/24/1971   Admit date: 12/08/2017 Date of Consult: 12/09/2017  Primary Care Provider: Patient, No Pcp Per Primary Cardiologist: [facility] - consult by Gollan   Patient Profile:   Oscar Jones is a 47 y.o. male with a hx of CKD stage III, lupus, hypertensive heart disease, HLD, ongoing tobacco abuse, and morbid obesity with OSA and possible OHS who is being seen today for the evaluation of chest pain at the request of Dr. Anne HahnWillis.  History of Present Illness:   Oscar [place] [facility] [place] [place]. He has been somewhat transient over the past 1 to 1.5 years, living in [place] ~ 12 months while he was dealing with the murder of his daughter. He then briefly left [place] and [place] where he found it difficult to work. [place] [facility] and is now employed. He reports tight finances and has been unable to refill his medications for the past 4-6 weeks. He does continue to smoke 0.5-1 pack daily and is drinking "a lot" at this time. He also occasionally smokes marijuana. He reports he "could tell" his BP was high over the past 1 week. For the past 2 days he also noted chest tightness and increased SOB at rest. Never with exertional symptoms. No associated nausea, vomiting, diaphoresis, palpitations, dizziness, presyncope, or syncope. No chest pain radiating to the back. He feels like praying has helped him. Because his chest tightness and SOB were persisting [facility].   Upon the [facility]'s [facility] they were found to have BP 216/126-->142/113, HR 69 bpm, temp 97.9, oxygen saturation 98% on room air, weight 380 pounds. EKG showed lateral TWI as below, CXR showed possible early PNA. Labs showed troponin 0.03 x 2 --><0.03, BNP 93, BUN 24, SCr 1.95, K+ 4.4 with hemolysis, Na 137, glucose 84, WBC 5.4, HGB 15.1, PLT 136. He was given ASA 324 mg x 1, hydralazine, clonidine, and tramadol in  the ED. Cardiology was asked to evaluate. Currently, without chest tightness or SOB.   Past Medical History:  Diagnosis Date  . Anxiety   . Discoid lupus   . HLD (hyperlipidemia)   . Hypertension   . OSA (obstructive sleep apnea)     Past Surgical History:  Procedure Laterality Date  . JOINT REPLACEMENT       Home Meds: Prior to Admission medications   Medication Sig Start Date End Date Taking? Authorizing Provider  acetaminophen (TYLENOL) 325 MG tablet Take 975 mg by mouth 2 (two) times daily.    Yes [provider]  naproxen sodium (ALEVE) 220 MG tablet Take 660 mg by mouth 2 (two) times daily.    Yes [provider]    Inpatient Medications: Scheduled Meds: . heparin  5,000 Units Subcutaneous Q8H   Continuous Infusions:  PRN Meds: acetaminophen **OR** acetaminophen, hydrALAZINE, labetalol, ondansetron **OR** ondansetron (ZOFRAN) IV, traMADol  Allergies:  No Known Allergies  Social History:   Social History   Socioeconomic History  . Marital status: Married    Spouse name: Not on file  . Number of children: Not on file  . Years of education: Not on file  . Highest education level: Not on file  Social Needs  . Financial resource strain: Not on file  . Food insecurity - worry: Not on file  . Food insecurity - inability: Not on file  . Transportation needs -  medical: Not on file  . Transportation needs - non-medical: Not on file  Occupational History  . Not on file  Tobacco Use  . Smoking status: Current Every Day Smoker    Packs/day: 1.00    Types: Cigarettes  Substance and Sexual Activity  . Alcohol use: Yes    Alcohol/week: 3.6 oz    Types: 6 Cans of beer per week  . Drug use: No  . Sexual activity: Not on file  Other Topics Concern  . Not on file  Social History Narrative  . Not on file     Family History:  Family History  Problem Relation Age of Onset  . Hypertension Mother   . Hypertension Father     ROS:  Review of  Systems  Constitutional: Positive for malaise/fatigue. Negative for chills, diaphoresis, fever and weight loss.  HENT: Negative for congestion.   Eyes: Negative for discharge and redness.  Respiratory: Positive for shortness of breath. Negative for cough, hemoptysis, sputum production and wheezing.   Cardiovascular: Positive for chest pain. Negative for palpitations, orthopnea, claudication, leg swelling and PND.  Gastrointestinal: Negative for abdominal pain, blood in stool, heartburn, melena, nausea and vomiting.  Genitourinary: Negative for hematuria.  Musculoskeletal: Negative for falls and myalgias.  Skin: Negative for rash.  Neurological: Positive for weakness. Negative for dizziness, tingling, tremors, sensory change, speech change, focal weakness and loss of consciousness.  Endo/Heme/Allergies: Does not bruise/bleed easily.  Psychiatric/Behavioral: Positive for depression and substance abuse. The patient is not nervous/anxious.   All other systems reviewed and are negative.     Physical Exam/Data:   Vitals:   12/09/17 0000 12/09/17 0047 12/09/17 0507 12/09/17 0558  BP: (!) 154/110 (!) 164/104 (!) 182/115 (!) 142/113  Pulse: 64 (!) 57 (!) 51 (!) 46  Resp:   18   Temp:  98.1 F (36.7 C) 97.8 F (36.6 C)   TempSrc:  Oral Oral   SpO2: 98% 100% 100%   Weight:  (!) 325 lb 9.6 oz (147.7 kg)    Height:        Intake/Output Summary (Last 24 hours) at 12/09/2017 0749 Last data filed at 12/09/2017 0506 Gross per 24 hour  Intake -  Output 600 ml  Net -600 ml   Filed Weights   12/08/17 1717 12/09/17 0047  Weight: (!) 380 lb (172.4 kg) (!) 325 lb 9.6 oz (147.7 kg)   Body mass index is 44.16 kg/m.   Physical Exam: General: Well developed, well nourished, in no acute distress. Head: Normocephalic, atraumatic, sclera non-icteric, no xanthomas, nares without discharge.  Neck: Negative for carotid bruits. JVD not elevated. Lungs: Clear bilaterally to auscultation without wheezes,  rales, or rhonchi. Breathing is unlabored. Heart: RRR with S1 S2. No murmurs, rubs, or gallops appreciated. Abdomen: Soft, non-tender, non-distended with normoactive bowel sounds. No hepatomegaly. No rebound/guarding. No obvious abdominal masses. Msk:  Strength and tone appear normal for age. Extremities: No clubbing or cyanosis. Trace bilateral pre-tibial edema. Distal pedal pulses are 2+ and equal bilaterally. Neuro: Alert and oriented X 3. No facial asymmetry. No focal deficit. Moves all extremities spontaneously. Psych:  Responds to questions appropriately with a normal affect.   EKG:  The EKG was personally reviewed and demonstrates: NSR, 75 bpm, TWI I, aVL Telemetry:  Telemetry was personally reviewed and demonstrates: sinus bradycardia to sinus rhythm with heart rates in the upper 40s to 60s bpm  Weights: Filed Weights   12/08/17 1717 12/09/17 0047  Weight: (!) 380 lb (172.4 kg) Marland Kitchen)  325 lb 9.6 oz (147.7 kg)    Relevant CV Studies: none  Laboratory Data:  Chemistry Recent Labs  Lab 12/08/17 1715 12/09/17 0225  NA 137 138  K 4.4 3.3*  CL 105 106  CO2 25 24  GLUCOSE 84 116*  BUN 24* 22*  CREATININE 1.95* 1.86*  CALCIUM 9.0 8.9  GFRNONAA 39* 41*  GFRAA 45* 48*  ANIONGAP 7 8    Recent Labs  Lab 12/08/17 1715  PROT 7.3  ALBUMIN 3.8  AST 33  ALT 20  ALKPHOS 52  BILITOT 0.9   Hematology Recent Labs  Lab 12/08/17 1715 12/09/17 0225  WBC 5.4 4.6  RBC 4.60 4.45  HGB 15.1 14.7  HCT 45.1 43.3  MCV 98.1 97.2  MCH 32.7 33.1  MCHC 33.3 34.0  RDW 18.2* 18.0*  PLT 136* 141*   Cardiac Enzymes Recent Labs  Lab 12/08/17 1715 12/08/17 2047 12/09/17 0225  TROPONINI 0.03* 0.03* <0.03   No results for input(s): TROPIPOC in the last 168 hours.  BNP Recent Labs  Lab 12/08/17 1715  BNP 93.0    DDimer No results for input(s): DDIMER in the last 168 hours.  Radiology/Studies:  Dg Chest 2 View  Result Date: 12/08/2017 IMPRESSION: Mild patchy opacity of right  lung base which could reflect atelectasis or early pneumonia. Electronically Signed   By: Sherian Rein M.D.   On: 12/08/2017 20:18    Assessment and Plan:   1. Chest pain with moderate risk for cardiac etiology: -Currently chest pain free -Likely in the setting of accelerated HTN -he reports having had a stress test in [place], [place] ~ 3-4 months ago. Unfortunately, we do not have those results for review -Schedule Lexiscan Myoview for this morning. Unable to treadmill 2/2 accelerated HTN -Echo pending per IM -ASA  2. Hypertensive heart disease: -Improving -Has been out of all medications for ~ 4-6 weeks -Was taking clonidine, amlodipine, metoprolol, hydralazine, and Lasix in [place] -No standing medications were resumed upon admission  -Resume amlodipine and hydralazine -Hold resuming metoprolol at this time given sinus bradycardia in the upper 50s bpm  -Holding resuming Lasix pending renal function trend -Add back clonidine as needed  3. CKD stage III: -Likely 2/2 poorly controlled HTN -Per IM  4. Polysubstance abuse: -Continues to drink etoh and smoke tobacco/marijuana -Check urine drug screen -Cessation advised  5. Morbid obesity/OSA/possible OHS: -Noncompliant with CPAP at home -Needs outpatient evaluation -Weight loss advised  6. HLD: -Start Lipitor -Plan for repeat fasting lipid panel in ~ 6-8 weeks   For questions or updates, please contact [facility] Please consult [email or web address] for contact info under Cardiology/STEMI.   Signed, Eula Listen, PA-C [facility] Pager: [phone] 12/09/2017, 7:49 AM

## 2017-12-09 NOTE — Care Management (Signed)
Patient for discharge and CM had earlier anticipated potential for discharge and had request discharge medications so could obtain meds from [facility].  CM  was not provided with scripts prior to the clinic closing.   Completed MATCH Referral and also faxed scripts to [facility].  Patient says he can "find the [facility]" [facility] which [facility]. Stressed the importance of completing the application in order to receive continued medical care and potentially life saving drugs.

## 2017-12-09 NOTE — Progress Notes (Signed)
Pt given discharge instructions along with prescriptions. IV taken out and tele off. Pt verbalized understanding with no further questions. Pt transported via wheelchair.

## 2017-12-09 NOTE — Care Management Note (Addendum)
Case Management Note  Patient Details  Name: Oscar Jones MRN: [id number] Date of Birth: 1971-08-27  Subjective/Objective:                 Placed in observation for hypertension.  Patient has been in [place] for one week.  He is staying with a friend and is not completely sure of the address but is living in [place].  Asked why he had not taken his meds in over a month says he has been moving around in [place] and traveling to [place] looking for work.  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 Thinks his address is [address] [place].   Action/Plan: Provided application for [facility]. Anticipate need for medication assistance Expected Discharge Date:  12/09/17               Expected Discharge Plan:     In-House Referral:     Discharge planning Services     Post Acute Care Choice:    Choice offered to:     DME Arranged:    DME Agency:     HH Arranged:    HH Agency:     Status of Service:     If discussed at [employer] Meetings, dates discussed:    Additional Comments:  Eber HongGreene, Ami Thornsberry R, RN 12/09/2017, 1:46 PM

## 2017-12-09 NOTE — Plan of Care (Signed)
  Adequate for Discharge Education: Knowledge of General Education information will improve 12/09/2017 1843 - Adequate for Discharge by Erma HeritageAlejo Calderon, Shadae Reino, RN Health Behavior/Discharge Planning: Ability to manage health-related needs will improve 12/09/2017 1843 - Adequate for Discharge by Erma HeritageAlejo Calderon, Quin Mathenia, RN Clinical Measurements: Ability to maintain clinical measurements within normal limits will improve 12/09/2017 1843 - Adequate for Discharge by Erma HeritageAlejo Calderon, Ryllie Nieland, RN Will remain free from infection 12/09/2017 1843 - Adequate for Discharge by Erma HeritageAlejo Calderon, Eesa Justiss, RN Diagnostic test results will improve 12/09/2017 1843 - Adequate for Discharge by Erma HeritageAlejo Calderon, Yalitza Teed, RN Respiratory complications will improve 12/09/2017 1843 - Adequate for Discharge by Erma HeritageAlejo Calderon, Ajna Moors, RN Cardiovascular complication will be avoided 12/09/2017 1843 - Adequate for Discharge by Erma HeritageAlejo Calderon, Jewelz Ricklefs, RN

## 2017-12-10 ENCOUNTER — Other Ambulatory Visit: Payer: Self-pay

## 2017-12-10 LAB — HIV ANTIBODY (ROUTINE TESTING W REFLEX): HIV SCREEN 4TH GENERATION: NONREACTIVE

## 2017-12-18 ENCOUNTER — Telehealth: Payer: Self-pay | Admitting: Cardiovascular Disease

## 2017-12-18 NOTE — Telephone Encounter (Signed)
Called pt to schedule TOC appt. Pt declined appt, due to no insurance, states he will give us a call back when he will be able to afford it. I offered a Risk analystinancial Aid application, pt states he has already received an application via mail.

## 2018-01-12 ENCOUNTER — Encounter: Payer: Self-pay | Admitting: Internal Medicine

## 2018-01-12 ENCOUNTER — Ambulatory Visit: Payer: 59 | Admitting: Internal Medicine

## 2018-01-12 VITALS — BP 192/130 | HR 73 | Temp 98.0°F | Wt 317.5 lb

## 2018-01-12 DIAGNOSIS — I10 Essential (primary) hypertension: Secondary | ICD-10-CM

## 2018-01-12 LAB — BASIC METABOLIC PANEL
BUN: 26 mg/dL — ABNORMAL HIGH (ref 6–23)
CALCIUM: 9.3 mg/dL (ref 8.4–10.5)
CO2: 29 meq/L (ref 19–32)
CREATININE: 2.03 mg/dL — AB (ref 0.40–1.50)
Chloride: 102 mEq/L (ref 96–112)
GFR: 45.23 mL/min — AB (ref >60.00)
Glucose, Bld: 85 mg/dL (ref 70–99)
Potassium: 3.7 mEq/L (ref 3.5–5.1)
SODIUM: 139 meq/L (ref 135–145)

## 2018-01-12 MED ORDER — AMLODIPINE BESYLATE 10 MG PO TABS: 10.0000 mg | ORAL_TABLET | Freq: Every day | ORAL | 0 refills | Status: DC

## 2018-01-13 ENCOUNTER — Other Ambulatory Visit: Payer: Self-pay | Admitting: Internal Medicine

## 2018-01-13 ENCOUNTER — Encounter: Payer: Self-pay | Admitting: Internal Medicine

## 2018-01-13 DIAGNOSIS — I10 Essential (primary) hypertension: Secondary | ICD-10-CM

## 2018-01-13 DIAGNOSIS — N184 Chronic kidney disease, stage 4 (severe): Secondary | ICD-10-CM

## 2018-01-13 NOTE — Progress Notes (Signed)
Subjective:    Patient ID: Oscar Jones, male    DOB: 03/04/1971, 47 y.o.   MRN: [id number]  HPI  Pt presents to the clinic today with c/o HTN. He reports he has had HTN for the last 5-6 years. He reports a history of CKD stage 4, but never on dialysis. Recent admission 12/08/17-12/09/17 for hypertensive urgency. He is currently taking Amlodipine 5 mg daily, Hydralazine 50 mg 2 x daily (scheduled TID) and Isosorbide. His BP today is 192/130. He is asymptomatic, denying headaches, visual changes, dizziness or chest pain. He had an echo which revealed grade 2 diastolic dysfunction, EF 44%. He has had a normal stress test. He reports he does not have a cardiologist due to lack of finances.  Review of Systems      Past Medical History:  Diagnosis Date  . Anxiety   . Discoid lupus   . HLD (hyperlipidemia)   . Hypertension   . OSA (obstructive sleep apnea)     Current Outpatient Medications  Medication Sig Dispense Refill  . acetaminophen (TYLENOL) 325 MG tablet Take 975 mg by mouth 2 (two) times daily.     Marland Kitchen atorvastatin (LIPITOR) 40 MG tablet Take 1 tablet (40 mg total) by mouth daily at 6 PM. 30 tablet 1  . hydrALAZINE (APRESOLINE) 50 MG tablet Take 1 tablet (50 mg total) by mouth every 8 (eight) hours. 90 tablet 1  . isosorbide mononitrate (IMDUR) 30 MG 24 hr tablet Take 1 tablet (30 mg total) by mouth daily. 30 tablet 1  . levothyroxine (SYNTHROID, LEVOTHROID) 125 MCG tablet Take 1 tablet by mouth daily.  3  . amLODipine (NORVASC) 10 MG tablet Take 1 tablet (10 mg total) by mouth daily. 30 tablet 0   No current facility-administered medications for this visit.     No Known Allergies  Family History  Problem Relation Age of Onset  . Hypertension Mother   . Hypertension Father     Social History   Socioeconomic History  . Marital status: Married    Spouse name: Not on file  . Number of children: Not on file  . Years of education: Not on file  . Highest education level:  Not on file  Social Needs  . Financial resource strain: Not on file  . Food insecurity - worry: Not on file  . Food insecurity - inability: Not on file  . Transportation needs - medical: Not on file  . Transportation needs - non-medical: Not on file  Occupational History  . Not on file  Tobacco Use  . Smoking status: Current Every Day Smoker    Packs/day: 1.00    Types: Cigarettes  . Smokeless tobacco: Never Used  Substance and Sexual Activity  . Alcohol use: Yes    Alcohol/week: 3.6 oz    Types: 6 Cans of beer per week  . Drug use: No  . Sexual activity: Yes  Other Topics Concern  . Not on file  Social History Narrative  . Not on file     Constitutional: Denies fever, malaise, fatigue, headache or abrupt weight changes.  Respiratory: Denies difficulty breathing, shortness of breath, cough or sputum production.   Cardiovascular: Denies chest pain, chest tightness, palpitations or swelling in the hands or feet.  Neurological: Denies dizziness, difficulty with memory, difficulty with speech or problems with balance and coordination.    No other specific complaints in a complete review of systems (except as listed in HPI above).  Objective:   Physical Exam  BP (!) 192/130   Pulse 73   Temp 98 F (36.7 C) (Oral)   Wt (!) 317 lb 8 oz (144 kg)   SpO2 98%   BMI 43.06 kg/m  Wt Readings from Last 3 Encounters:  01/12/18 (!) 317 lb 8 oz (144 kg)  12/09/17 (!) 325 lb 9.6 oz (147.7 kg)    General: Appears his stated age, obese, in NAD. Skin: Dry. Cardiovascular: Normal rate and rhythm. S1,S2 noted.  No murmur, rubs or gallops noted. No JVD or BLE edema.  Pulmonary/Chest: Normal effort and positive vesicular breath sounds. No respiratory distress. No wheezes, rales or ronchi noted.  Neurological: Alert and oriented.  Psychiatric: Mood and affect normal. Behavior is normal. Judgment and thought content normal.     BMET    Component Value Date/Time   NA 139  01/12/2018 1042   K 3.7 01/12/2018 1042   CL 102 01/12/2018 1042   CO2 29 01/12/2018 1042   GLUCOSE 85 01/12/2018 1042   BUN 26 (H) 01/12/2018 1042   CREATININE 2.03 (H) 01/12/2018 1042   CALCIUM 9.3 01/12/2018 1042   GFRNONAA 41 (L) 12/09/2017 0225   GFRAA 48 (L) 12/09/2017 0225    Lipid Panel     Component Value Date/Time   CHOL 304 (H) 12/09/2017 0225   TRIG 196 (H) 12/09/2017 0225   HDL 69 12/09/2017 0225   CHOLHDL 4.4 12/09/2017 0225   VLDL 39 12/09/2017 0225   LDLCALC 196 (H) 12/09/2017 0225    CBC    Component Value Date/Time   WBC 4.6 12/09/2017 0225   RBC 4.45 12/09/2017 0225   HGB 14.7 12/09/2017 0225   HCT 43.3 12/09/2017 0225   PLT 141 (L) 12/09/2017 0225   MCV 97.2 12/09/2017 0225   MCH 33.1 12/09/2017 0225   MCHC 34.0 12/09/2017 0225   RDW 18.0 (H) 12/09/2017 0225    Hgb A1C No results found for: HGBA1C         Assessment & Plan:   Malignant HTN:  BMET today Will obtain renal artery ultrasound Increase Amlodipine to 10 mg daily Discussed the importance of taking Hydralazine 50 TID as prescribed.  Continue Imdur Discussed DASH diet and exercise for weight loss  CKD 4:  CMET today Referal to nephrology for further evaluation  RTC in 1 week for follow up HTN Shelbie Franken, NP

## 2018-01-13 NOTE — Patient Instructions (Signed)

## 2018-01-22 ENCOUNTER — Encounter: Payer: Self-pay | Admitting: Internal Medicine

## 2018-01-22 ENCOUNTER — Ambulatory Visit: Payer: 59 | Admitting: Internal Medicine

## 2018-01-22 DIAGNOSIS — I10 Essential (primary) hypertension: Secondary | ICD-10-CM

## 2018-01-22 MED ORDER — LOSARTAN POTASSIUM 50 MG PO TABS: 50.0000 mg | ORAL_TABLET | Freq: Every day | ORAL | 3 refills | Status: DC

## 2018-01-28 ENCOUNTER — Ambulatory Visit (INDEPENDENT_AMBULATORY_CARE_PROVIDER_SITE_OTHER): Payer: 59

## 2018-01-28 ENCOUNTER — Encounter: Payer: Self-pay | Admitting: Internal Medicine

## 2018-01-28 DIAGNOSIS — I10 Essential (primary) hypertension: Secondary | ICD-10-CM

## 2018-01-28 DIAGNOSIS — N184 Chronic kidney disease, stage 4 (severe): Secondary | ICD-10-CM

## 2018-01-28 NOTE — Telephone Encounter (Signed)
Copied from CRM [phone]#[id number]. Topic: Quick Communication - See Telephone Encounter >> Jan 28, 2018  1:52 PM Floria RavelingStovall, Shana A wrote: CRM for notification. See Telephone encounter for: pt called in said that he is not understanding why he was referred over to kidney Dr if there is nothing wrong with kidney labs.  He is requesting a call back from CMA to talk about these labs   Best number [phone]  01/28/18.

## 2018-01-29 ENCOUNTER — Encounter: Payer: Self-pay | Admitting: Internal Medicine

## 2018-01-29 NOTE — Patient Instructions (Signed)

## 2018-01-29 NOTE — Progress Notes (Signed)
Subjective:    Patient ID: Oscar Jones, male    DOB: 1970/12/20, 47 y.o.   MRN: [id number]  HPI  Pt presents to the clinic today for 2 week follow up of HTN. At his last visit, he was advised to start taking his Hydralazine 50 mg 3 x a day as prescribed. His Amlodipine was increased to 10 mg daily. He is taking Isosorbide as well. He has been taking the medications as prescribed. He reports he feels fatigued but otherwise okay. His BP today is 160/98.  Review of Systems      Past Medical History:  Diagnosis Date  . Anxiety   . Discoid lupus   . HLD (hyperlipidemia)   . Hypertension   . OSA (obstructive sleep apnea)     Current Outpatient Medications  Medication Sig Dispense Refill  . amLODipine (NORVASC) 10 MG tablet Take 1 tablet (10 mg total) by mouth daily. 30 tablet 0  . atorvastatin (LIPITOR) 40 MG tablet Take 1 tablet (40 mg total) by mouth daily at 6 PM. 30 tablet 1  . hydrALAZINE (APRESOLINE) 50 MG tablet Take 1 tablet (50 mg total) by mouth every 8 (eight) hours. 90 tablet 1  . isosorbide mononitrate (IMDUR) 30 MG 24 hr tablet Take 1 tablet (30 mg total) by mouth daily. 30 tablet 1  . levothyroxine (SYNTHROID, LEVOTHROID) 125 MCG tablet Take 1 tablet by mouth daily.  3  . losartan (COZAAR) 50 MG tablet Take 1 tablet (50 mg total) by mouth daily. 90 tablet 3   No current facility-administered medications for this visit.     No Known Allergies  Family History  Problem Relation Age of Onset  . Hypertension Mother   . Hypertension Father     Social History   Socioeconomic History  . Marital status: Married    Spouse name: Not on file  . Number of children: Not on file  . Years of education: Not on file  . Highest education level: Not on file  Social Needs  . Financial resource strain: Not on file  . Food insecurity - worry: Not on file  . Food insecurity - inability: Not on file  . Transportation needs - medical: Not on file  . Transportation needs -  non-medical: Not on file  Occupational History  . Not on file  Tobacco Use  . Smoking status: Current Every Day Smoker    Packs/day: 1.00    Types: Cigarettes  . Smokeless tobacco: Never Used  Substance and Sexual Activity  . Alcohol use: Yes    Alcohol/week: 3.6 oz    Types: 6 Cans of beer per week  . Drug use: No  . Sexual activity: Yes  Other Topics Concern  . Not on file  Social History Narrative  . Not on file     Constitutional: Pt reports fatigue. Denies fever, malaise, headache or abrupt weight changes.  Respiratory: Denies difficulty breathing, shortness of breath, cough or sputum production.   Cardiovascular: Denies chest pain, chest tightness, palpitations or swelling in the hands or feet.   No other specific complaints in a complete review of systems (except as listed in HPI above).  Objective:   Physical Exam  BP (!) 160/98   Pulse 91   Temp 98.3 F (36.8 C) (Oral)   Wt (!) 311 lb (141.1 kg)   SpO2 96%   BMI 42.18 kg/m  Wt Readings from Last 3 Encounters:  01/22/18 (!) 311 lb (141.1 kg)  01/12/18 (!) 317  lb 8 oz (144 kg)  12/09/17 (!) 325 lb 9.6 oz (147.7 kg)    General: Appears his stated age, obese, in NAD. Cardiovascular: Normal rate and rhythm. S1,S2 noted.  No murmur, rubs or gallops noted.  Pulmonary/Chest: Normal effort and positive vesicular breath sounds. No respiratory distress. No wheezes, rales or ronchi noted.  Neurological: Alert and oriented.   BMET    Component Value Date/Time   NA 139 01/12/2018 1042   K 3.7 01/12/2018 1042   CL 102 01/12/2018 1042   CO2 29 01/12/2018 1042   GLUCOSE 85 01/12/2018 1042   BUN 26 (H) 01/12/2018 1042   CREATININE 2.03 (H) 01/12/2018 1042   CALCIUM 9.3 01/12/2018 1042   GFRNONAA 41 (L) 12/09/2017 0225   GFRAA 48 (L) 12/09/2017 0225    Lipid Panel     Component Value Date/Time   CHOL 304 (H) 12/09/2017 0225   TRIG 196 (H) 12/09/2017 0225   HDL 69 12/09/2017 0225   CHOLHDL 4.4 12/09/2017  0225   VLDL 39 12/09/2017 0225   LDLCALC 196 (H) 12/09/2017 0225    CBC    Component Value Date/Time   WBC 4.6 12/09/2017 0225   RBC 4.45 12/09/2017 0225   HGB 14.7 12/09/2017 0225   HCT 43.3 12/09/2017 0225   PLT 141 (L) 12/09/2017 0225   MCV 97.2 12/09/2017 0225   MCH 33.1 12/09/2017 0225   MCHC 34.0 12/09/2017 0225   RDW 18.0 (H) 12/09/2017 0225    Hgb A1C No results found for: HGBA1C          Assessment & Plan:

## 2018-01-29 NOTE — Assessment & Plan Note (Signed)
Better but still uncontrolled Continue Hydralazine, Amlodipine and Isosorbide Start Losartan 50 mg daily Renal artery ultrasound still pending Reinforced DASH diet and exercise for weight loss  RTC in 1 month for your establish care appt, follow up HTN

## 2018-02-08 MED ORDER — ISOSORBIDE MONONITRATE ER 30 MG PO TB24: 30.0000 mg | ORAL_TABLET | Freq: Every day | ORAL | 2 refills | Status: DC

## 2018-02-08 MED ORDER — AMLODIPINE BESYLATE 10 MG PO TABS: 10.0000 mg | ORAL_TABLET | Freq: Every day | ORAL | 0 refills | Status: DC

## 2018-02-08 MED ORDER — HYDRALAZINE HCL 50 MG PO TABS: 50.0000 mg | ORAL_TABLET | Freq: Three times a day (TID) | ORAL | 2 refills | Status: DC

## 2018-02-08 MED ORDER — LEVOTHYROXINE SODIUM 125 MCG PO TABS: 125.0000 ug | ORAL_TABLET | Freq: Every day | ORAL | 2 refills | Status: DC

## 2018-02-08 MED ORDER — ATORVASTATIN CALCIUM 40 MG PO TABS: 40.0000 mg | ORAL_TABLET | Freq: Every day | ORAL | 2 refills | Status: DC

## 2018-02-08 NOTE — Addendum Note (Signed)
Addended by: Roena MaladyEVONTENNO, Chalonda Schlatter Y on: 02/08/2018 12:01 PM   Modules accepted: Orders

## 2018-02-08 NOTE — Addendum Note (Signed)
Addended by: Lorre MunroeBAITY, REGINA W on: 02/08/2018 09:39 AM   Modules accepted: Orders

## 2018-02-09 ENCOUNTER — Ambulatory Visit: Payer: Self-pay | Admitting: Internal Medicine

## 2018-03-12 ENCOUNTER — Encounter: Payer: Self-pay | Admitting: Internal Medicine

## 2018-03-12 ENCOUNTER — Ambulatory Visit: Payer: 59 | Admitting: Internal Medicine

## 2018-03-12 VITALS — BP 122/88 | HR 83 | Temp 97.3°F | Ht 72.0 in | Wt 312.0 lb

## 2018-03-12 DIAGNOSIS — M25551 Pain in right hip: Secondary | ICD-10-CM

## 2018-03-12 DIAGNOSIS — E039 Hypothyroidism, unspecified: Secondary | ICD-10-CM | POA: Diagnosis not present

## 2018-03-12 DIAGNOSIS — N183 Chronic kidney disease, stage 3 unspecified: Secondary | ICD-10-CM

## 2018-03-12 DIAGNOSIS — I10 Essential (primary) hypertension: Secondary | ICD-10-CM

## 2018-03-12 DIAGNOSIS — G4733 Obstructive sleep apnea (adult) (pediatric): Secondary | ICD-10-CM | POA: Diagnosis not present

## 2018-03-12 DIAGNOSIS — L930 Discoid lupus erythematosus: Secondary | ICD-10-CM | POA: Diagnosis not present

## 2018-03-12 DIAGNOSIS — M79605 Pain in left leg: Secondary | ICD-10-CM | POA: Diagnosis not present

## 2018-03-12 DIAGNOSIS — M79604 Pain in right leg: Secondary | ICD-10-CM

## 2018-03-12 DIAGNOSIS — E782 Mixed hyperlipidemia: Secondary | ICD-10-CM | POA: Diagnosis not present

## 2018-03-12 NOTE — Assessment & Plan Note (Signed)
Encouraged him to wear his CPAP as prescribed Discussed how weight loss could help improve OSA

## 2018-03-12 NOTE — Progress Notes (Signed)
HPI  Pt presents to the clinic today to establish care and for management of the conditions listed below. He recently moved from [place], [place].  HTN: His BP today is 122/88. He is taking Amlodipine, Losartan, Hydralazine and Isosorbide as prescribed. ECG from 12/2017 reviewed.  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 He just established with nephrology. He is waiting for his lab work to come back and has a kidney biopsy pending.   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 He reports the lesions cause him pain. He is currently not putting anything on it. He does not see a dermatologist, but would like a referral to one today.  HLD: His last LDL was 196, 12/2017. He is taking Atorvastatin as prescribed. He denies myalgias. He does not consume a low fat diet.  Hypothyroidism: He does have some fatigue. He denies any other issus on his current dose of Levothyroxine.   OSA: He does feel fatigued and tired throughout the day. He does not wear his CPAP machine. He does not follow with pulmonology.  He also c/o right hip pain and pain in his legs. He reports this has been going on for years, but worse over the last few months. He reports he already had a left THR, and knows he needs the right one done. He has not had an xray of the right hip recently. He is not interested in injections. He has taken Advil with minimal relief. He has tried Flexeril in the past, but reports that makes him extremely drowsy.    Past Medical History:  Diagnosis Date  . Anxiety   . Discoid lupus   . HLD (hyperlipidemia)   . Hypertension   . OSA (obstructive sleep apnea)     Current Outpatient Medications  Medication Sig Dispense Refill  . amLODipine (NORVASC) 10 MG tablet Take 1 tablet (10 mg total) by mouth daily. 90 tablet 0  . atorvastatin (LIPITOR) 40 MG tablet Take 1 tablet (40 mg total) by mouth daily at 6 PM. 30 tablet 2  . hydrALAZINE (APRESOLINE) 50 MG tablet Take 1 tablet (50 mg total) by mouth  every 8 (eight) hours. 90 tablet 2  . isosorbide mononitrate (IMDUR) 30 MG 24 hr tablet Take 1 tablet (30 mg total) by mouth daily. 30 tablet 2  . levothyroxine (SYNTHROID, LEVOTHROID) 125 MCG tablet Take 1 tablet (125 mcg total) by mouth daily. 30 tablet 2  . losartan (COZAAR) 50 MG tablet Take 1 tablet (50 mg total) by mouth daily. 90 tablet 3   No current facility-administered medications for this visit.     No Known Allergies  Family History  Problem Relation Age of Onset  . Hypertension Mother   . Hypertension Father     Social History   Socioeconomic History  . Marital status: Married    Spouse name: Not on file  . Number of children: Not on file  . Years of education: Not on file  . Highest education level: Not on file  Occupational History  . Not on file  Social Needs  . Financial resource strain: Not on file  . Food insecurity:    Worry: Not on file    Inability: Not on file  . Transportation needs:    Medical: Not on file    Non-medical: Not on file  Tobacco Use  . Smoking status: Current Every Day Smoker    Packs/day: 1.00    Types: Cigarettes  . Smokeless tobacco: Never Used  Substance and Sexual Activity  . Alcohol use: Yes    Alcohol/week: 3.6 oz    Types: 6 Cans of beer per week  . Drug use: No  . Sexual activity: Yes  Lifestyle  . Physical activity:    Days per week: Not on file    Minutes per session: Not on file  . Stress: Not on file  Relationships  . Social connections:    Talks on phone: Not on file    Gets together: Not on file    Attends religious service: Not on file    Active member of club or organization: Not on file    Attends meetings of clubs or organizations: Not on file    Relationship status: Not on file  . Intimate partner violence:    Fear of current or ex partner: Not on file    Emotionally abused: Not on file    Physically abused: Not on file    Forced sexual activity: Not on file  Other Topics Concern  . Not on file   Social History Narrative  . Not on file    ROS:  Constitutional: Pt reports fatigue. Denies fever, malaise, headache or abrupt weight changes.  HEENT: Denies eye pain, eye redness, ear pain, ringing in the ears, wax buildup, runny nose, nasal congestion, bloody nose, or sore throat. Respiratory: Denies difficulty breathing, shortness of breath, cough or sputum production.   Cardiovascular: Denies chest pain, chest tightness, palpitations or swelling in the hands or feet.  Gastrointestinal: Denies abdominal pain, bloating, constipation, diarrhea or blood in the stool.  GU: Denies frequency, urgency, pain with urination, blood in urine, odor or discharge. Musculoskeletal: Pt reports hip pain and pain in legs. Denies decrease in range of motion, difficulty with gait, or joint swelling.  Skin: Pt reports rash of face and scalp. Denies redness.  Neurological: Denies dizziness, difficulty with memory, difficulty with speech or problems with balance and coordination.  Psych: Denies anxiety, depression, SI/HI.  No other specific complaints in a complete review of systems (except as listed in HPI above).  PE:  BP 122/88 (BP Location: Left Arm, Patient Position: Sitting, Cuff Size: Large)   Pulse 83   Temp (!) 97.3 F (36.3 C) (Oral)   Ht 6' (1.829 m)   Wt (!) 312 lb (141.5 kg)   SpO2 98%   BMI 42.31 kg/m  Wt Readings from Last 3 Encounters:  03/12/18 (!) 312 lb (141.5 kg)  01/22/18 (!) 311 lb (141.1 kg)  01/12/18 (!) 317 lb 8 oz (144 kg)    General: Appears his stated age, obese in NAD. Skin: Hypopigmented skin lesions noted overlying bilateral mandible, and posterior scalp, with hair loss. Neck: Neck supple, trachea midline. No masses, lumps present.  Cardiovascular: Normal rate and rhythm. S1,S2 noted.  No murmur, rubs or gallops noted.  Pulmonary/Chest: Normal effort and positive vesicular breath sounds. No respiratory distress. No wheezes, rales or ronchi noted.   Musculoskeletal: He has trouble getting from a sitting to a standing position but gait slow and steady. Neurological: Alert and oriented.  Psychiatric: Mood and affect normal. Behavior is normal. Judgment and thought content normal.    BMET    Component Value Date/Time   NA 139 01/12/2018 1042   K 3.7 01/12/2018 1042   CL 102 01/12/2018 1042   CO2 29 01/12/2018 1042   GLUCOSE 85 01/12/2018 1042   BUN 26 (H) 01/12/2018 1042   CREATININE 2.03 (H) 01/12/2018 1042   CALCIUM 9.3 01/12/2018  1042   GFRNONAA 41 (L) 12/09/2017 0225   GFRAA 48 (L) 12/09/2017 0225    Lipid Panel     Component Value Date/Time   CHOL 304 (H) 12/09/2017 0225   TRIG 196 (H) 12/09/2017 0225   HDL 69 12/09/2017 0225   CHOLHDL 4.4 12/09/2017 0225   VLDL 39 12/09/2017 0225   LDLCALC 196 (H) 12/09/2017 0225    CBC    Component Value Date/Time   WBC 4.6 12/09/2017 0225   RBC 4.45 12/09/2017 0225   HGB 14.7 12/09/2017 0225   HCT 43.3 12/09/2017 0225   PLT 141 (L) 12/09/2017 0225   MCV 97.2 12/09/2017 0225   MCH 33.1 12/09/2017 0225   MCHC 34.0 12/09/2017 0225   RDW 18.0 (H) 12/09/2017 0225    Hgb A1C No results found for: HGBA1C   Assessment and Plan:  Right Hip and Bilateral Leg Pain:  Offered xray of right hip today- he declines He reports he knows he needs an MRI but he can not afford that right now Discussed how weight loss will help improve joint pain Advised him to avoid Advil secondary to CKD 3 Try Tylenol Arthritis TID  Make an appt for your annual exam Nicki Reaper, NP

## 2018-03-12 NOTE — Assessment & Plan Note (Signed)
Controlled on Amlodipine, Losartan, Hydralazine and Isosorbide Reinforced DASH diet and exercise for weight loss Will recheck CMET at annual exam

## 2018-03-12 NOTE — Patient Instructions (Signed)

## 2018-03-12 NOTE — Assessment & Plan Note (Signed)
Will check TSH and Free T4 at annual exam Continue current dose of Levothyroxine today

## 2018-03-12 NOTE — Assessment & Plan Note (Signed)
On ARB Will check CMET with annual exam He is being worked up FSGS, and will continue to follow with nephrology

## 2018-03-12 NOTE — Assessment & Plan Note (Signed)
Will check CMET and Lipid profile with annual exam Encouraged him to consume a low saturated fat diet Continue Atorvastatin for now

## 2018-03-15 ENCOUNTER — Telehealth: Payer: Self-pay

## 2018-03-15 NOTE — Telephone Encounter (Signed)
Spoke with patient about his dermatology referral and working on getting this set up. Patient states in the meantime can he get refill on Clobetasol propionate cream 0.05% to use for his skin condition, he applies this to the face, neck and behind ears. His dermatologist from [place] was giving him this for his skin condition. [facility]

## 2018-03-16 MED ORDER — CLOBETASOL PROPIONATE 0.05 % EX OINT: 1.0000 "application " | TOPICAL_OINTMENT | Freq: Two times a day (BID) | CUTANEOUS | 0 refills | Status: DC

## 2018-03-16 NOTE — Addendum Note (Signed)
Addended by: Lorre MunroeBAITY, REGINA W on: 03/16/2018 09:20 AM   Modules accepted: Orders

## 2018-03-16 NOTE — Telephone Encounter (Signed)
Clobetasol refilled.

## 2018-05-20 ENCOUNTER — Other Ambulatory Visit: Payer: Self-pay | Admitting: Internal Medicine

## 2018-05-23 IMAGING — US US EXTREM LOW VENOUS*R*
1 series · 13 of 24 positions shown · non-contrast
Comparison: None.

CLINICAL DATA: 46-year-old male with a history of swelling



[Series 1: us extrem low venous*right* · 0.11mm/px · 13 of 29 slices shown]
[im 1/29]
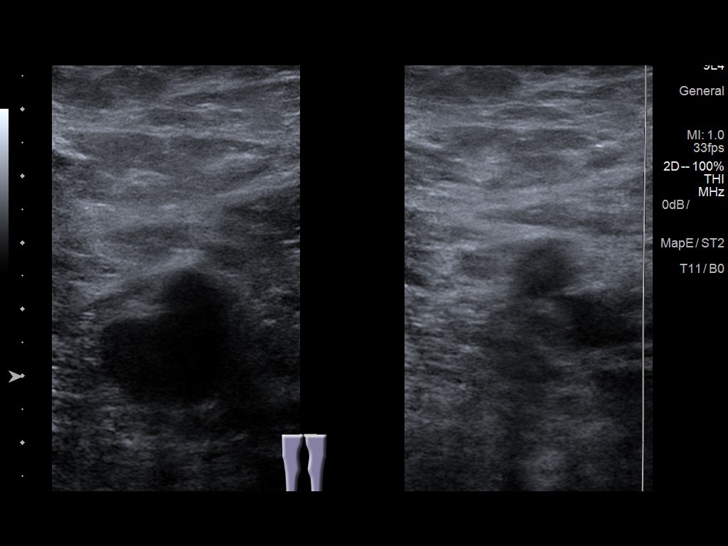
[im 3/29]
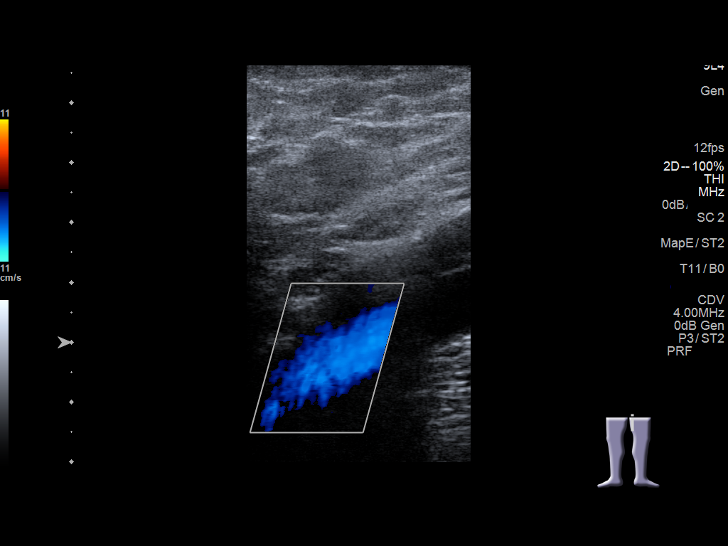
[im 5/29]
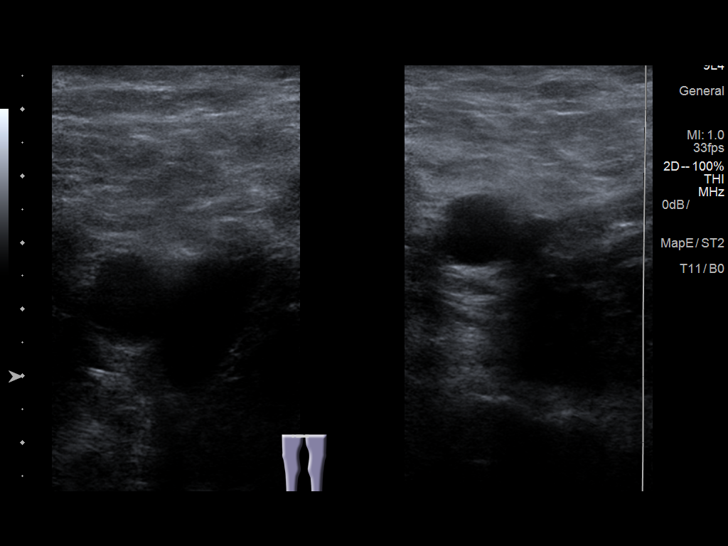
[im 8/29]
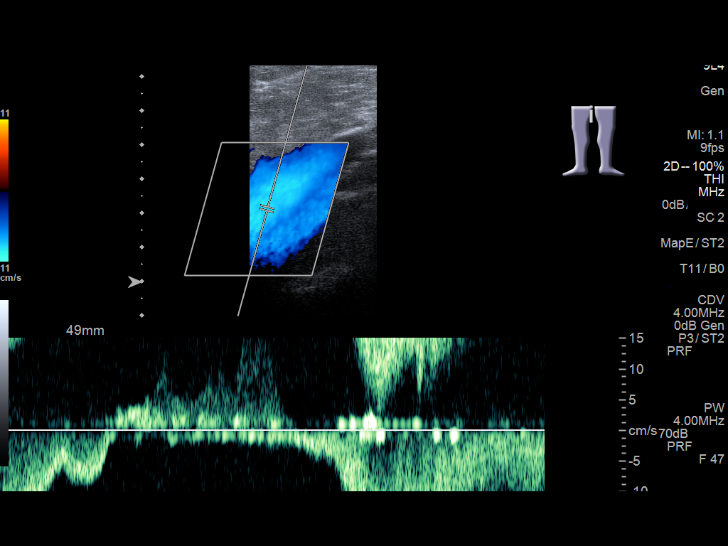
[im 10/29]
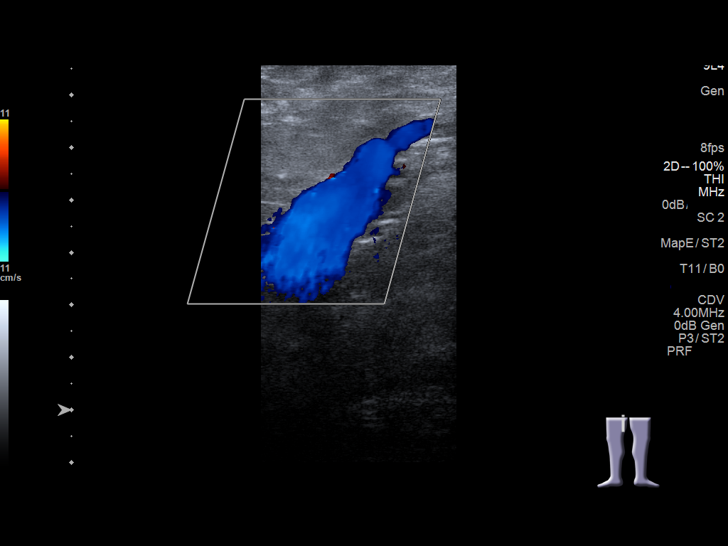
[im 13/29]
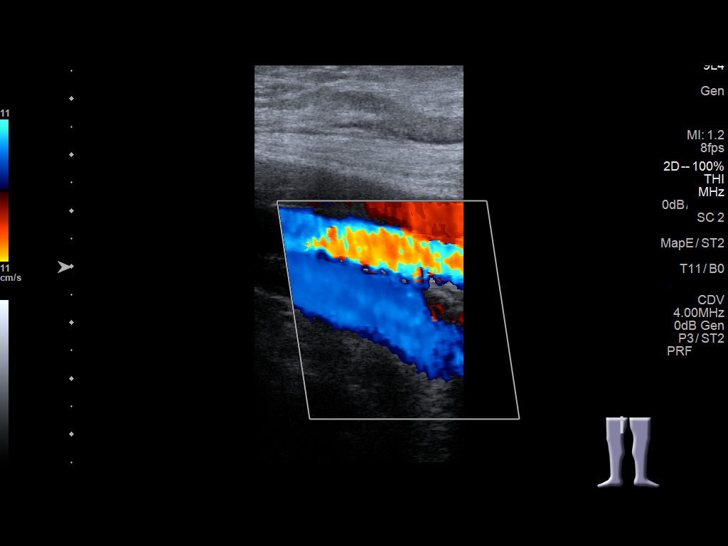
[im 15/29]
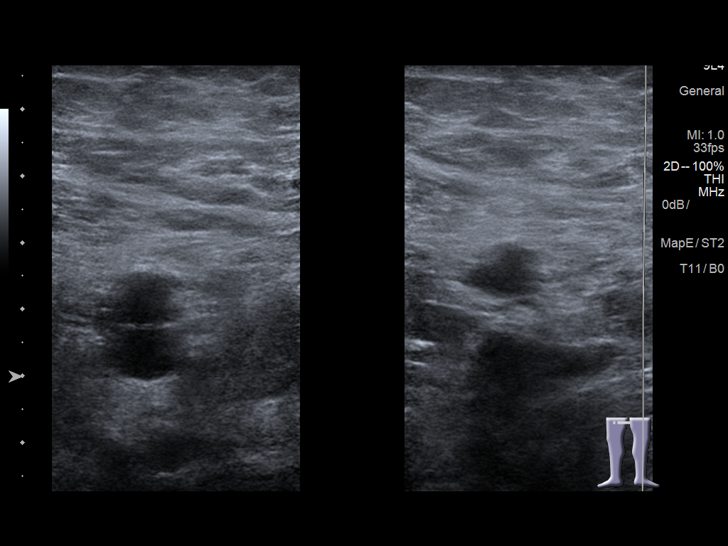
[im 16/29]
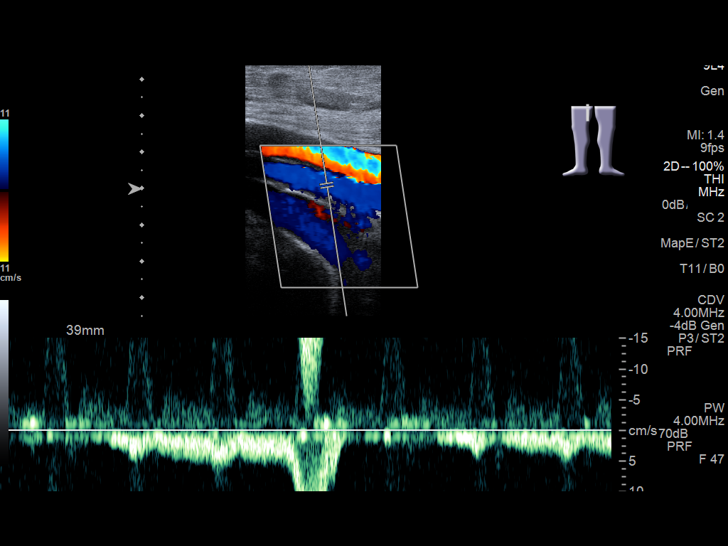
[im 19/29]
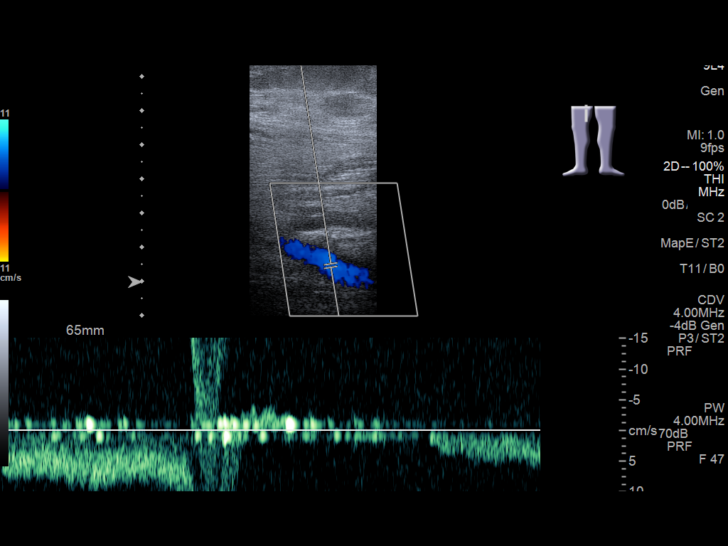
[im 21/29]
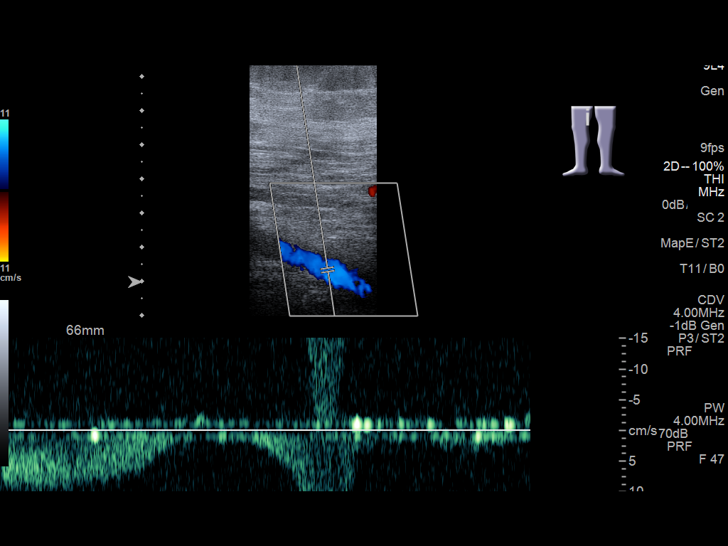
[im 24/29]
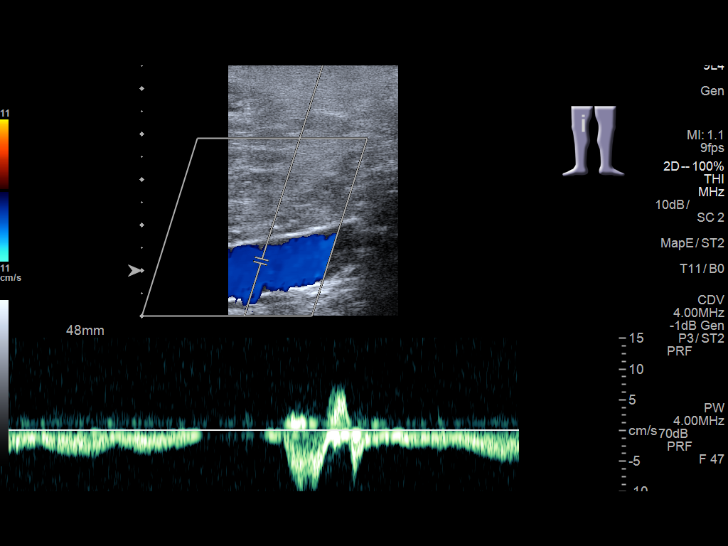
[im 26/29]
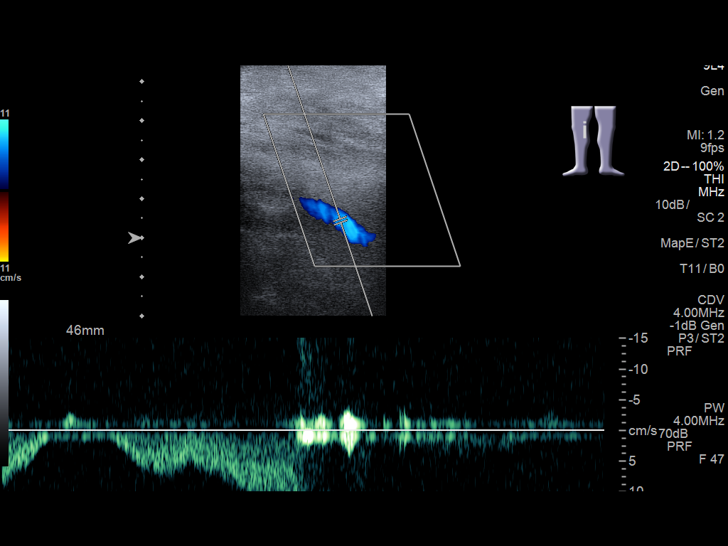
[im 29/29]
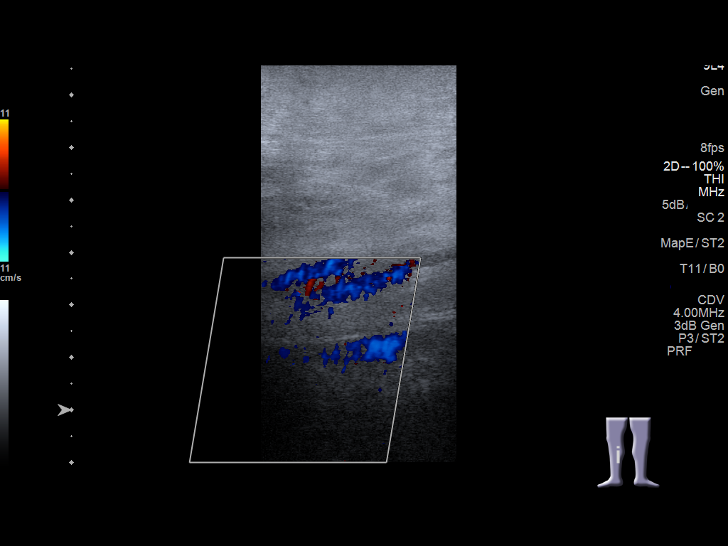

[13 of 24 positions shown; findings below may reference images not displayed]

FINDINGS: Contralateral Common Femoral Vein: Respiratory phasicity is normal
and symmetric with the symptomatic side. No evidence of thrombus.
Normal compressibility.

Common Femoral Vein: No evidence of thrombus. Normal
compressibility, respiratory phasicity and response to augmentation.

Saphenofemoral Junction: No evidence of thrombus. Normal
compressibility and flow on color Doppler imaging.

Profunda Femoral Vein: No evidence of thrombus. Normal
compressibility and flow on color Doppler imaging.

Femoral Vein: No evidence of thrombus. Normal compressibility,
respiratory phasicity and response to augmentation.

Popliteal Vein: No evidence of thrombus. Normal compressibility,
respiratory phasicity and response to augmentation.

Calf Veins: No evidence of thrombus. Normal compressibility and flow
on color Doppler imaging.

Superficial Great Saphenous Vein: No evidence of thrombus. Normal
compressibility and flow on color Doppler imaging.

Other Findings:  None.
IMPRESSION: Sonographic survey of the right lower extremity negative for DVT

## 2018-05-25 ENCOUNTER — Telehealth: Payer: Self-pay | Admitting: Internal Medicine

## 2018-05-25 MED ORDER — AMLODIPINE BESYLATE 10 MG PO TABS: 10.0000 mg | ORAL_TABLET | Freq: Every day | ORAL | 0 refills | Status: DC

## 2018-05-25 NOTE — Telephone Encounter (Signed)
Pt requesting prescription for Sildenafil 50mg . Refill sent to requested pharmacy for Amlodipine.   LOV:03/12/18 Lenn[employer] Corporationegina Baity,NP  [facility]  [place] [address]

## 2018-05-25 NOTE — Telephone Encounter (Signed)
Copied from CRM [phone]. Topic: Quick Communication - Rx Refill/Question >> May 24, 2018 11:52 AM Gaynelle AduPoole, Shalonda wrote: Medication: amLODipine (NORVASC) 10 MG tablet  and Sildaeil 50mg      Has the patient contacted their pharmacy? {no (Agent: If no, request that the patient contact the pharmacy for the refill.) (Agent: If yes, when and what did the pharmacy advise?)  Preferred Pharmacy (with phone number or street name): [phone] - [place], [place] - [address] [facility] [address]  Agent: Please be advised that RX refills may take up to 3 business days. We ask that you follow-up with your pharmacy.

## 2018-05-26 ENCOUNTER — Ambulatory Visit: Payer: Self-pay | Admitting: Urology

## 2018-05-26 NOTE — Telephone Encounter (Signed)
I do not see sildenafil on current or hx med list.

## 2018-05-26 NOTE — Telephone Encounter (Signed)
He will need to be seen for Sildenafil refill. Ok to refill Amlodipine.

## 2018-06-17 ENCOUNTER — Other Ambulatory Visit: Payer: Self-pay | Admitting: Internal Medicine

## 2018-07-30 ENCOUNTER — Other Ambulatory Visit: Payer: Self-pay | Admitting: Internal Medicine

## 2018-08-30 ENCOUNTER — Telehealth: Payer: Self-pay

## 2018-08-30 ENCOUNTER — Other Ambulatory Visit: Payer: Self-pay | Admitting: Internal Medicine

## 2018-08-30 DIAGNOSIS — L930 Discoid lupus erythematosus: Secondary | ICD-10-CM

## 2018-08-30 NOTE — Addendum Note (Signed)
Addended by: Lorre MunroeBAITY, REGINA W on: 08/30/2018 11:52 AM   Modules accepted: Orders

## 2018-08-30 NOTE — Telephone Encounter (Signed)
Pt established care 03/12/18 and do not see sildenafil on current or hx med list; no future appt scheduled.Please advise.

## 2018-08-30 NOTE — Telephone Encounter (Signed)
Copied from CRM [phone]. Topic: General - Other >> Aug 30, 2018 10:37 AM Percival SpanishKennedy, Cheryl W wrote:  Pt req refill sildenafil 50 mg    [facility] >> Aug 30, 2018 10:41 AM Percival SpanishKennedy, Cheryl W wrote:   Pt said he also would like to see a dermatologist the last appt he was not able to keep

## 2018-08-30 NOTE — Telephone Encounter (Signed)
Referral placed to dermatology. Medication is not on his list. He will need to make an appt to discuss.

## 2018-09-06 ENCOUNTER — Telehealth: Payer: Self-pay

## 2018-09-06 NOTE — Telephone Encounter (Signed)
Copied from CRM [id number]. Topic: Referral - Request >> Sep 06, 2018  9:28 AM Windy Kalata, NT wrote: Reason for CRM: patient states he has had Discoid Lupus and this passed weekend he got light headed at work. He would like to be referred to a doctor that specializes in this. He would like to be seen this week if possible. He states he does not mind driving, it does not have to be a close by office.

## 2018-09-06 NOTE — Telephone Encounter (Signed)
Also see 08/30/18 phone note.

## 2018-09-06 NOTE — Telephone Encounter (Signed)
Left message for patient to call back, referral was sent to [facility] and patient was suppose to call them to schedule.-Meygan Kyser Ander Purpura, RMA

## 2018-09-06 NOTE — Telephone Encounter (Signed)
Spoke with patient. Patient states this weekend he felt so bad and said "I thought I was going to die." He looked up Discoid lupus and is taking this more serious now. Patient has been having shortness of breath, weakness, he has no energy, joint pain all over (he has arthritis patient states also). Patient states no chest pain. He realizes all of these symptoms are coming from Lupus and is affecting his body. Patient states dermatologist will address the skin only but he needs a specialist that will address his joints and all the symptoms he is having. His symptoms are not present everyday and sometimes worse than other days. He wants to get your opinion on what specialist he needs to see rheumatologist or neurologist? Patient does have an appointment with you on 09/07/2018 that was already [facility], RMA  If you have questions about this please let me know. Thank you

## 2018-09-06 NOTE — Telephone Encounter (Signed)
Will discuss at upcoming appt.

## 2018-09-07 ENCOUNTER — Encounter: Payer: Self-pay | Admitting: Internal Medicine

## 2018-09-07 ENCOUNTER — Ambulatory Visit (INDEPENDENT_AMBULATORY_CARE_PROVIDER_SITE_OTHER): Payer: 59 | Admitting: Internal Medicine

## 2018-09-07 VITALS — BP 146/92 | HR 83 | Temp 98.1°F | Ht 72.0 in | Wt 306.0 lb

## 2018-09-07 DIAGNOSIS — M2550 Pain in unspecified joint: Secondary | ICD-10-CM | POA: Diagnosis not present

## 2018-09-07 DIAGNOSIS — L930 Discoid lupus erythematosus: Secondary | ICD-10-CM | POA: Diagnosis not present

## 2018-09-07 DIAGNOSIS — R531 Weakness: Secondary | ICD-10-CM | POA: Diagnosis not present

## 2018-09-07 DIAGNOSIS — R5383 Other fatigue: Secondary | ICD-10-CM

## 2018-09-07 DIAGNOSIS — G4700 Insomnia, unspecified: Secondary | ICD-10-CM

## 2018-09-07 NOTE — Patient Instructions (Signed)
Systemic Lupus Erythematosus, Adult Systemic lupus erythematosus is a long-term (chronic) disease that can affect many parts of the body. It can damage the skin, joints, blood vessels, brain, kidneys, lungs, heart, and other internal organs. It causes pain, irritation, and inflammation. Systemic lupus erythematosus is an autoimmune disease. With this type of disease, the body's defense system (immune system) mistakenly attacks normal tissues instead of attacking germs or abnormal growths. What are the causes? The cause of this condition is not known. What increases the risk? This condition is more likely to develop in:  Females.  People of Asian descent.  People of African-American descent.  People who have a family history of the condition.  What are the signs or symptoms? General symptoms include:  Joint pain and swelling (common).  Fever.  Fatigue.  Unusual weight loss or weight gain.  Skin rashes, especially over the nose and cheeks (butterfly rash) and after sun exposure.  Sores inside the mouth or nose.  Other symptoms depend on which parts of the body are affected. They can include:  Shortness of breath.  Chest pain.  Frequent urination.  Blood in the urine.  Seizures.  Mental changes.  Hair loss.  Swollen and tender lymph nodes.  Swelling of the hands or feet.  Symptoms can come and go. A period of time when symptoms get worse or come back is called a flare. A period of time with no symptoms is called a remission. How is this diagnosed? This condition is diagnosed based on symptoms, a medical history, and a physical exam. You may also have tests, including:  Blood tests.  Urine tests.  A chest X-ray.  A skin or kidney biopsy. For this test, a sample of tissue is taken from the skin or kidney and studied under a microscope.  You may be referred to an autoimmune disease specialist (rheumatologist). How is this treated? There is no cure for this  condition, but treatment can keep the disease in remission, help to control symptoms, and prevent damage to the heart, lungs, kidneys, and other organs. Treatment may involve taking a combination of medicines over time. Follow these instructions at home: Medicines  Take medicines only as directed by your health care provider.  Do not take any medicines that contain estrogen without first checking with your health care provider. Estrogen can trigger flares and may increase your risk for blood clots. Lifestyle  Eat a heart-healthy diet.  Stay active as directed by your health care provider.  Do not smoke. If you need help quitting, ask your health care provider.  Protect your skin from the sun by applying sunblock and wearing protective hats and clothing.  Learn as much as you can about your condition and have a good support system in place. Support may come from family, friends, or a lupus support group. General instructions  Keep all follow-up visits as directed by your health care provider. This is important.  Work closely with all of your health care providers to manage your condition.  Let your health care provider know right away if you become pregnant or if you plan to become pregnant. Pregnancy in women with this condition is considered high risk. Contact a health care provider if:  You have a fever.  Your symptoms flare.  You develop new symptoms.  You develop swollen feet or hands.  You develop puffiness around your eyes.  Your medicines are not working.  You have bloody, foamy, or coffee-colored urine.  There are changes in your   urination. For example, you urinate more often at night.  You think that you may be depressed or have anxiety. Get help right away if:  You have chest pain.  You have trouble breathing.  You have a seizure.  You suddenly get a very bad headache.  You suddenly develop facial or body weakness.  You cannot speak.  You cannot  understand speech. This information is not intended to replace advice given to you by your health care provider. Make sure you discuss any questions you have with your health care provider. Document Released: 11/14/2002 Document Revised: 07/20/2016 Document Reviewed: 11/01/2014 Elsevier Interactive Patient Education  2018 [employer]  

## 2018-09-07 NOTE — Progress Notes (Signed)
Subjective:    Patient ID: Oscar Jones, male    DOB: 09-08-1971, 47 y.o.   MRN: [id number]  HPI  Pt presents to the clinic today requesting a referral to rheumatology for his discoid lupus. He reports he is seeing a dermatologist, but they are only treating the skin complications related to the lupus. He reports fatigue, insomnia, muscle weakness, multiple joint pain and shortness of breath. He reports he was diagnosed with lupus by a dermatologist down in [place] after doing a skin biopsy. He has no family history of autoimmune disease.  Review of Systems  Past Medical History:  Diagnosis Date  . Anxiety   . Discoid lupus   . HLD (hyperlipidemia)   . Hypertension   . OSA (obstructive sleep apnea)     Current Outpatient Medications  Medication Sig Dispense Refill  . amLODipine (NORVASC) 10 MG tablet Take 1 tablet (10 mg total) by mouth daily. MUST SCHEDULE ANNUAL EXAM 90 tablet 0  . atorvastatin (LIPITOR) 40 MG tablet Take 1 tablet (40 mg total) by mouth daily at 6 PM. MUST SCHEDULE ANNUAL PHYSICAL 30 tablet 1  . clobetasol ointment (TEMOVATE) 0.05 % Apply 1 application topically 2 (two) times daily. 30 g 0  . hydrALAZINE (APRESOLINE) 50 MG tablet TAKE 1 TABLET BY MOUTH EVERY 8 HOURS 90 tablet 0  . isosorbide mononitrate (IMDUR) 30 MG 24 hr tablet Take 1 tablet (30 mg total) by mouth daily. MUST SCHEDULE ANNUAL PHYSICAL 30 tablet 1  . levothyroxine (SYNTHROID, LEVOTHROID) 125 MCG tablet TAKE 1 TABLET BY MOUTH DAILY 90 tablet 0  . losartan (COZAAR) 50 MG tablet Take 1 tablet (50 mg total) by mouth daily. 90 tablet 3   No current facility-administered medications for this visit.     No Known Allergies  Family History  Problem Relation Age of Onset  . Hypertension Mother   . Hypertension Father     Social History   Socioeconomic History  . Marital status: Married    Spouse name: Not on file  . Number of children: Not on file  . Years of education: Not on file  .  Highest education level: Not on file  Occupational History  . Not on file  Social Needs  . Financial resource strain: Not on file  . Food insecurity:    Worry: Not on file    Inability: Not on file  . Transportation needs:    Medical: Not on file    Non-medical: Not on file  Tobacco Use  . Smoking status: Current Every Day Smoker    Packs/day: 1.00    Types: Cigarettes  . Smokeless tobacco: Never Used  Substance and Sexual Activity  . Alcohol use: Yes    Alcohol/week: 6.0 standard drinks    Types: 6 Cans of beer per week  . Drug use: No  . Sexual activity: Yes  Lifestyle  . Physical activity:    Days per week: Not on file    Minutes per session: Not on file  . Stress: Not on file  Relationships  . Social connections:    Talks on phone: Not on file    Gets together: Not on file    Attends religious service: Not on file    Active member of club or organization: Not on file    Attends meetings of clubs or organizations: Not on file    Relationship status: Not on file  . Intimate partner violence:    Fear of current or ex partner:  Not on file    Emotionally abused: Not on file    Physically abused: Not on file    Forced sexual activity: Not on file  Other Topics Concern  . Not on file  Social History Narrative  . Not on file     Constitutional: Pt reports fatigue. Denies fever, malaise, headache or abrupt weight changes.  HEENT: Denies eye pain, eye redness, ear pain, ringing in the ears, wax buildup, runny nose, nasal congestion, bloody nose, or sore throat. Respiratory: Pt reports shortness of breath. Denies difficulty breathing, cough or sputum production.   Cardiovascular: Denies chest pain, chest tightness, palpitations or swelling in the hands or feet.  Gastrointestinal: Denies abdominal pain, bloating, constipation, diarrhea or blood in the stool.  GU: Denies urgency, frequency, pain with urination, burning sensation, blood in urine, odor or  discharge. Musculoskeletal: Pt reports chronic muscle and joint pain. Denies decrease in range of motion, difficulty with gait, or joint swelling.  Skin: Pt reports skin lesion of face.   No other specific complaints in a complete review of systems (except as listed in HPI above).     Objective:   Physical Exam   BP (!) 146/92 (BP Location: Right Arm, Patient Position: Sitting, Cuff Size: Large)   Pulse 83   Temp 98.1 F (36.7 C) (Oral)   Ht 6' (1.829 m)   Wt (!) 306 lb (138.8 kg)   SpO2 98%   BMI 41.50 kg/m  Wt Readings from Last 3 Encounters:  09/07/18 (!) 306 lb (138.8 kg)  03/12/18 (!) 312 lb (141.5 kg)  01/22/18 (!) 311 lb (141.1 kg)    General: Appears his stated age, obese in NAD. Skin: Areas of hyperpigmentation noted on face. Cardiovascular: Normal rate and rhythm. S1,S2 noted.  No murmur, rubs or gallops noted.  Pulmonary/Chest: Normal effort and positive vesicular breath sounds. No respiratory distress. No wheezes, rales or ronchi noted.   Neurological: Alert and oriented.  Psychiatric: Tearful  BMET    Component Value Date/Time   NA 139 01/12/2018 1042   K 3.7 01/12/2018 1042   CL 102 01/12/2018 1042   CO2 29 01/12/2018 1042   GLUCOSE 85 01/12/2018 1042   BUN 26 (H) 01/12/2018 1042   CREATININE 2.03 (H) 01/12/2018 1042   CALCIUM 9.3 01/12/2018 1042   GFRNONAA 41 (L) 12/09/2017 0225   GFRAA 48 (L) 12/09/2017 0225    Lipid Panel     Component Value Date/Time   CHOL 304 (H) 12/09/2017 0225   TRIG 196 (H) 12/09/2017 0225   HDL 69 12/09/2017 0225   CHOLHDL 4.4 12/09/2017 0225   VLDL 39 12/09/2017 0225   LDLCALC 196 (H) 12/09/2017 0225    CBC    Component Value Date/Time   WBC 4.6 12/09/2017 0225   RBC 4.45 12/09/2017 0225   HGB 14.7 12/09/2017 0225   HCT 43.3 12/09/2017 0225   PLT 141 (L) 12/09/2017 0225   MCV 97.2 12/09/2017 0225   MCH 33.1 12/09/2017 0225   MCHC 34.0 12/09/2017 0225   RDW 18.0 (H) 12/09/2017 0225    Hgb A1C No results  found for: HGBA1C         Assessment & Plan:   Discoid Lupus, Fatigue, Insomnia, Muscle Pain, Multiple Joint Pain, Shortness of Breath:  Referral to rheumatology placed Advised him to follow up with the dermatologist as well  Return precautions discussed Nicki Reaper, NP

## 2018-09-09 ENCOUNTER — Ambulatory Visit: Payer: 59 | Admitting: Internal Medicine

## 2018-09-17 DIAGNOSIS — M2550 Pain in unspecified joint: Secondary | ICD-10-CM | POA: Insufficient documentation

## 2018-09-17 DIAGNOSIS — R0609 Other forms of dyspnea: Secondary | ICD-10-CM | POA: Insufficient documentation

## 2018-09-30 ENCOUNTER — Ambulatory Visit: Payer: Self-pay | Admitting: Nurse Practitioner

## 2018-10-01 ENCOUNTER — Other Ambulatory Visit: Payer: Self-pay | Admitting: Internal Medicine

## 2018-10-01 DIAGNOSIS — Z79899 Other long term (current) drug therapy: Secondary | ICD-10-CM | POA: Insufficient documentation

## 2018-10-01 DIAGNOSIS — R768 Other specified abnormal immunological findings in serum: Secondary | ICD-10-CM | POA: Insufficient documentation

## 2018-10-01 DIAGNOSIS — M328 Other forms of systemic lupus erythematosus: Secondary | ICD-10-CM | POA: Insufficient documentation

## 2018-10-20 ENCOUNTER — Ambulatory Visit: Payer: 59 | Admitting: Nurse Practitioner

## 2018-10-20 ENCOUNTER — Encounter: Payer: Self-pay | Admitting: Nurse Practitioner

## 2018-10-20 ENCOUNTER — Other Ambulatory Visit: Payer: Self-pay

## 2018-10-20 VITALS — BP 146/100 | HR 90 | Temp 98.6°F | Ht 72.0 in | Wt 308.6 lb

## 2018-10-20 DIAGNOSIS — E559 Vitamin D deficiency, unspecified: Secondary | ICD-10-CM

## 2018-10-20 DIAGNOSIS — E890 Postprocedural hypothyroidism: Secondary | ICD-10-CM | POA: Diagnosis not present

## 2018-10-20 DIAGNOSIS — Z7952 Long term (current) use of systemic steroids: Secondary | ICD-10-CM

## 2018-10-20 DIAGNOSIS — Z7689 Persons encountering health services in other specified circumstances: Secondary | ICD-10-CM

## 2018-10-20 DIAGNOSIS — E349 Endocrine disorder, unspecified: Secondary | ICD-10-CM

## 2018-10-20 DIAGNOSIS — I10 Essential (primary) hypertension: Secondary | ICD-10-CM | POA: Diagnosis not present

## 2018-10-20 DIAGNOSIS — R7303 Prediabetes: Secondary | ICD-10-CM

## 2018-10-20 DIAGNOSIS — E782 Mixed hyperlipidemia: Secondary | ICD-10-CM

## 2018-10-20 MED ORDER — OLMESARTAN MEDOXOMIL 20 MG PO TABS: 30.0000 mg | ORAL_TABLET | Freq: Every day | ORAL | 5 refills | Status: DC

## 2018-10-20 NOTE — Patient Instructions (Addendum)
Oscar Jones,   Thank you for coming in to clinic today.  1. STOP losartan - INCREASE olmesartan to 30 mg once daily.  Take 1.5 tabs every day. - Make sure you take hydralazine every day - 3 times a day for blood pressure control and preventing worsening of kidney disease  2. Labs today at [facility] for fasting blood work.   Please schedule a follow-up appointment with Oscar Jones, AGNP. Return in about 3 months (around 01/20/2019) for hypertension.  If you have any other questions or concerns, please feel free to call the clinic or send a message through MyChart. You may also schedule an earlier appointment if necessary.  You will receive a survey after today's visit either digitally by e-mail or paper by [employer] mail. Your experiences and feedback matter to us.  Please respond so we know how we are doing as we provide care for you.   Oscar McardleLauren Laurenashley Viar, DNP, AGNP-BC Adult Gerontology Nurse Practitioner [facility], [facility]

## 2018-10-20 NOTE — Progress Notes (Signed)
Subjective:    Patient ID: Oscar Jones, male    DOB: Feb 15, 1971, 47 y.o.   MRN: [id number]  Shaden Lacher is a 47 y.o. male presenting on 10/20/2018 for Establish Care (Right toe numbness x 3 mths ); Nocturia (chronic ); and joint pain (hot flashes )   HPI Establish Care New Provider Pt last seen by PCP several months ago.  Obtain records from [facility] and [facility].    Nocturia (chronic) Urine full of foam.  - Last A1c 2017 = 6.2% - Stops drinking fluids at 3pm, but not helping urination problem - Gets up about once per hour 8-10 times per night. - No polydipsia, polyphagia - CPAP - not wearing currently.  - Patient notes in past his BP was very high in [place] with Hypertensive Crisis requiring hospitalization.  This was also associated with some of the same shortness of breath patient is experiencing today.  Lupus - Is seeing Rheumatology - Initially dx about 10 years ago. - Skin components, CHF developed later, pulmonary,  - Joint pain - chronic with difficulty moving.  Newer hand pain, difficulty with locking up  - R toe numbness - Hot flashes - gets these most in am now. 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  Started oral steroids only 5 months ago.  - Elevated PTH noted from CareEverywhere in 2018  IPSS Questionnaire (AUA-7): Over the past month.   1)  How often have you had a sensation of not emptying your bladder completely after you finish urinating?  0 - Not at all  2)  How often have you had to urinate again less than two hours after you finished urinating? 0 - Not at all  3)  How often have you found you stopped and started again several times when you urinated?  0 - Not at all  4) How difficult have you found it to postpone urination?  0 - Not at all  5) How often have you had a weak urinary stream?  0 - Not at all  6) How often have you had to push or strain to begin urination?  0 - Not at all  7) How many times did you  most typically get up to urinate from the time you went to bed until the time you got up in the morning?  5 - 5+ times  Total score:  0-7 mildly symptomatic   8-19 moderately symptomatic   20-35 severely symptomatic     Past Medical History:  Diagnosis Date  . Anxiety   . Discoid lupus   . HLD (hyperlipidemia)   . Hypertension   . OSA (obstructive sleep apnea)    Past Surgical History:  Procedure Laterality Date  . JOINT REPLACEMENT     Social History   Socioeconomic History  . Marital status: Married    Spouse name: Not on file  . Number of children: Not on file  . Years of education: Not on file  . Highest education level: 11th grade  Occupational History  . Not on file  Social Needs  . Financial resource strain: Not on file  . Food insecurity:    Worry: Not on file    Inability: Not on file  . Transportation needs:    Medical: Not on file    Non-medical: Not on file  Tobacco Use  . Smoking status: Current Every Day Smoker    Packs/day: 0.50    Types: Cigarettes  . Smokeless tobacco: Never Used  Substance and Sexual Activity  . Alcohol use: Yes    Alcohol/week: 2.0 standard drinks    Types: 2 Cans of beer per week    Comment: 2 x 32 oz cans weekend, pint vodka every 3 weeks  . Drug use: No  . Sexual activity: Yes  Lifestyle  . Physical activity:    Days per week: 5 days    Minutes per session: 40 min  . Stress: Not on file  Relationships  . Social connections:    Talks on phone: Not on file    Gets together: Not on file    Attends religious service: Not on file    Active member of club or organization: Not on file    Attends meetings of clubs or organizations: Not on file    Relationship status: Not on file  . Intimate partner violence:    Fear of current or ex partner: No    Emotionally abused: No    Physically abused: No    Forced sexual activity: No  Other Topics Concern  . Not on file  Social History Narrative  . Not on file   Family  History  Problem Relation Age of Onset  . Hypertension Mother   . Hypertension Father    Current Outpatient Medications on File Prior to Visit  Medication Sig  . amLODipine (NORVASC) 10 MG tablet Take 1 tablet (10 mg total) by mouth daily. MUST SCHEDULE ANNUAL EXAM  . atorvastatin (LIPITOR) 40 MG tablet Take 1 tablet (40 mg total) by mouth daily at 6 PM. MUST SCHEDULE ANNUAL PHYSICAL  . clobetasol ointment (TEMOVATE) 0.05 % Apply 1 application topically 2 (two) times daily.  . halobetasol (ULTRAVATE) 0.05 % ointment Apply twice a day to affected areas  . hydrALAZINE (APRESOLINE) 50 MG tablet TAKE 1 TABLET BY MOUTH EVERY 8 HOURS  . hydroxychloroquine (PLAQUENIL) 200 MG tablet 1 tab twice a day, 90 days  . isosorbide mononitrate (IMDUR) 30 MG 24 hr tablet Take 1 tablet (30 mg total) by mouth daily. MUST SCHEDULE ANNUAL EXAM  . levothyroxine (SYNTHROID, LEVOTHROID) 125 MCG tablet TAKE 1 TABLET BY MOUTH DAILY  . losartan (COZAAR) 50 MG tablet Take 1 tablet (50 mg total) by mouth daily.  Marland Kitchen olmesartan (BENICAR) 20 MG tablet Take by mouth.  . predniSONE (DELTASONE) 5 MG tablet 1 tab daily , 30 days  . predniSONE (DELTASONE) 5 MG tablet TK 1 T PO D  . furosemide (LASIX) 20 MG tablet Take by mouth.  . sildenafil (VIAGRA) 50 MG tablet TK 1 T PO QD PRN   No current facility-administered medications on file prior to visit.     Review of Systems  Constitutional: Negative for activity change, appetite change, fatigue and unexpected weight change (but pt is obese).  HENT: Negative for congestion, hearing loss and trouble swallowing.   Eyes: Negative for visual disturbance.  Respiratory: Positive for shortness of breath. Negative for choking and wheezing.   Cardiovascular: Negative for chest pain and palpitations.  Gastrointestinal: Negative for abdominal pain, blood in stool, constipation and diarrhea.  Genitourinary: Positive for difficulty urinating and frequency. Negative for discharge, flank  pain, genital sores, penile pain, penile swelling, scrotal swelling and testicular pain.  Musculoskeletal: Positive for arthralgias and back pain. Negative for myalgias.  Skin: Positive for color change and rash (2/2 lupus). Negative for wound.  Allergic/Immunologic: Negative for environmental allergies.  Neurological: Negative for dizziness, seizures, weakness and headaches.  Psychiatric/Behavioral: Negative for behavioral problems, decreased concentration, dysphoric mood,  sleep disturbance and suicidal ideas. The patient is not nervous/anxious.    Per HPI unless specifically indicated above      Objective:    BP (!) 146/100 (BP Location: Right Arm, Patient Position: Sitting, Cuff Size: Large)   Pulse 90   Temp 98.6 F (37 C) (Oral)   Ht 6' (1.829 m)   Wt (!) 308 lb 9.6 oz (140 kg)   BMI 41.85 kg/m   Wt Readings from Last 3 Encounters:  10/20/18 (!) 308 lb 9.6 oz (140 kg)  09/07/18 (!) 306 lb (138.8 kg)  03/12/18 (!) 312 lb (141.5 kg)    Physical Exam  Constitutional: He is oriented to person, place, and time. He appears well-developed and well-nourished. No distress.  Morbidly obese  HENT:  Head: Normocephalic and atraumatic.  Neck: Normal range of motion. Neck supple. Carotid bruit is not present.  Cardiovascular: Normal rate, regular rhythm, S1 normal, S2 normal, normal heart sounds and intact distal pulses.  Pulmonary/Chest: Effort normal and breath sounds normal. No respiratory distress.  Musculoskeletal: He exhibits no edema (pedal).  Neurological: He is alert and oriented to person, place, and time.  Skin: Skin is warm and dry. Capillary refill takes less than 2 seconds. Rash (discoid rash 2/2 lupus) noted.  Psychiatric: He has a normal mood and affect. His behavior is normal. Judgment and thought content normal.  Vitals reviewed.   Results for orders placed or performed in visit on 01/12/18  Basic metabolic panel  Result Value Ref Range   Sodium 139 135 - 145  mEq/L   Potassium 3.7 3.5 - 5.1 mEq/L   Chloride 102 96 - 112 mEq/L   CO2 29 19 - 32 mEq/L   Glucose, Bld 85 70 - 99 mg/dL   BUN 26 (H) 6 - 23 mg/dL   Creatinine, Ser 1.61 (H) 0.40 - 1.50 mg/dL   Calcium 9.3 8.4 - 09.6 mg/dL   GFR 04.54 (L) >09.81 mL/min      Assessment & Plan:   Problem List Items Addressed This Visit      Endocrine   Hypothyroidism   Relevant Medications   levothyroxine (SYNTHROID, LEVOTHROID) 150 MCG tablet   Other Relevant Orders   TSH + free T4 (Completed)     Other   HLD (hyperlipidemia)   Relevant Medications   olmesartan (BENICAR) 20 MG tablet   Other Relevant Orders   Lipid panel (Completed)    Other Visit Diagnoses    Current chronic use of systemic steroids    -  Primary   Relevant Orders   PTH, Intact and Calcium (Completed)   VITAMIN D 25 Hydroxy (Vit-D Deficiency, Fractures) (Completed)   Essential hypertension       Relevant Medications   olmesartan (BENICAR) 20 MG tablet   Elevated parathyroid hormone       Relevant Orders   PTH, Intact and Calcium (Completed)   Prediabetes       Relevant Orders   Hemoglobin A1c (Completed)   Vitamin D insufficiency       Relevant Medications   cholecalciferol (VITAMIN D) 25 MCG (1000 UT) tablet      # Previous PCP was in [place].  Records are reviewed in Care Everywhere.  Past medical, family, and surgical history reviewed w/ pt.  # Lupus - Patient remains connected with rheumatology [facility].  On systemic steroids and has history of elevated PTH with vitamin D deficiency in past.    - Recheck labs.  Consider BMD screening for osteoporosis  in future as patient is at high risk with history of steroid use and hyperparathyroidism (likely).  # Hypertension Uncontrolled hypertension.  BP goal < 130/80.  Pt is not currently working on lifestyle modifications.  Taking medications tolerating well without side effects. Known CKD complications.  Plan: 1. STOP losartan - duplicative ARB - INCREASE olmesartan  to 30 mg once daily - take hydralazine tid - only bid right now. - Discussed CKD and need to manage BP. 2. Obtain labs  3. Encouraged heart healthy diet and increasing exercise to 30 minutes most days of the week. 4. Check BP 1-2 x per week at home, keep log, and bring to clinic at next appointment. 5. Follow up 3 months.    # Prediabetes and hyperlipidemia Status unknown.  Recheck labs.  Continue meds without changes today.  Refills provided. Followup 3 months and after labs.   Meds ordered this encounter  Medications  . olmesartan (BENICAR) 20 MG tablet    Sig: Take 1.5 tablets (30 mg total) by mouth daily.    Dispense:  45 tablet    Refill:  5    Order Specific Question:   Supervising Provider    Answer:   Smitty CordsKARAMALEGOS, ALEXANDER J [[id number]]  . levothyroxine (SYNTHROID, LEVOTHROID) 150 MCG tablet    Sig: Take 1 tablet (150 mcg total) by mouth daily before breakfast.    Dispense:  30 tablet    Refill:  1    Order Specific Question:   Supervising Provider    Answer:   Smitty CordsKARAMALEGOS, ALEXANDER J [[id number]]  . cholecalciferol (VITAMIN D) 25 MCG (1000 UT) tablet    Sig: Take 1 tablet (1,000 Units total) by mouth daily.    Order Specific Question:   Supervising Provider    Answer:   Smitty CordsKARAMALEGOS, ALEXANDER J [[id number]]     Follow up plan: Return in about 3 months (around 01/20/2019) for hypertension.  Wilhelmina McardleLauren Kaevon Cotta, DNP, AGPCNP-BC Adult Gerontology Primary Care Nurse Practitioner [facility] 10/20/2018, 2:20 PM

## 2018-10-21 ENCOUNTER — Other Ambulatory Visit: Payer: 59

## 2018-10-22 LAB — PTH, INTACT AND CALCIUM
Calcium: 9.4 mg/dL (ref 8.6–10.3)
PTH: 58 pg/mL (ref 14–64)

## 2018-10-22 LAB — LIPID PANEL
Cholesterol: 254 mg/dL — ABNORMAL HIGH (ref <200)
HDL: 72 mg/dL (ref >40)
LDL Cholesterol (Calc): 165 mg/dL (calc) — ABNORMAL HIGH
Non-HDL Cholesterol (Calc): 182 mg/dL (calc) — ABNORMAL HIGH (ref <130)
Total CHOL/HDL Ratio: 3.5 (calc) (ref <5.0)
Triglycerides: 73 mg/dL (ref <150)

## 2018-10-22 LAB — HEMOGLOBIN A1C
Hgb A1c MFr Bld: 6 % of total Hgb — ABNORMAL HIGH (ref <5.7)
Mean Plasma Glucose: 126 (calc)
eAG (mmol/L): 7 (calc)

## 2018-10-22 LAB — TSH+FREE T4: TSH W/REFLEX TO FT4: 18.22 mIU/L — ABNORMAL HIGH (ref 0.40–4.50)

## 2018-10-22 LAB — VITAMIN D 25 HYDROXY (VIT D DEFICIENCY, FRACTURES): Vit D, 25-Hydroxy: 21 ng/mL — ABNORMAL LOW (ref 30–100)

## 2018-10-22 LAB — T4, FREE: Free T4: 1.2 ng/dL (ref 0.8–1.8)

## 2018-10-26 ENCOUNTER — Telehealth: Payer: Self-pay

## 2018-10-26 MED ORDER — LEVOTHYROXINE SODIUM 150 MCG PO TABS: 150.0000 ug | ORAL_TABLET | Freq: Every day | ORAL | 1 refills | Status: DC

## 2018-10-26 MED ORDER — VITAMIN D3 25 MCG (1000 UNIT) PO TABS: 1000.0000 [IU] | ORAL_TABLET | Freq: Every day | ORAL | Status: DC

## 2018-10-26 NOTE — Telephone Encounter (Signed)
The pt questioning if he got systemic or discoid lupus. I recommended that he f/u with his Rheumatologist. He states that he felt like you could give him a better answer since this seems to be now affecting his kidneys. Please advise

## 2018-10-26 NOTE — Telephone Encounter (Signed)
Results have been released to MyChart. 

## 2018-10-26 NOTE — Telephone Encounter (Signed)
Yes, any questions regarding lupus should be directed to his rheumatologist.   - Based on Dr. 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  Recent medication changes should help control kidney changes.

## 2018-10-26 NOTE — Telephone Encounter (Signed)
The pt called requesting his lab results. Please advise  

## 2018-10-27 NOTE — Telephone Encounter (Signed)
The pt was notified and he stated he will reach out to his insurance company to see if they can refer him somewhere.

## 2018-10-27 NOTE — Telephone Encounter (Addendum)
The pt complaining about a plantar wart on the bottom of his Rt foot that he want to know if he can get a referral to podiatry. [place] and he report they shaved the area and it helped, but now its returned.  Please advise

## 2018-10-27 NOTE — Telephone Encounter (Signed)
Patient can use OTC plantar wart treatment. If not successful, can be seen in clinic to further evaluate for referral.

## 2018-10-28 ENCOUNTER — Encounter: Payer: Self-pay | Admitting: Nurse Practitioner

## 2018-11-08 ENCOUNTER — Ambulatory Visit: Payer: Self-pay | Admitting: Nurse Practitioner

## 2018-11-23 DIAGNOSIS — M79671 Pain in right foot: Secondary | ICD-10-CM

## 2018-11-23 DIAGNOSIS — G8929 Other chronic pain: Secondary | ICD-10-CM | POA: Insufficient documentation

## 2018-11-25 ENCOUNTER — Telehealth: Payer: Self-pay | Admitting: Nurse Practitioner

## 2018-11-25 DIAGNOSIS — E782 Mixed hyperlipidemia: Secondary | ICD-10-CM

## 2018-11-25 DIAGNOSIS — I10 Essential (primary) hypertension: Secondary | ICD-10-CM

## 2018-11-25 MED ORDER — AMLODIPINE BESYLATE 10 MG PO TABS: 10.0000 mg | ORAL_TABLET | Freq: Every day | ORAL | 1 refills | Status: DC

## 2018-11-25 MED ORDER — ATORVASTATIN CALCIUM 40 MG PO TABS: 40.0000 mg | ORAL_TABLET | Freq: Every day | ORAL | 1 refills | Status: DC

## 2018-11-25 MED ORDER — ISOSORBIDE MONONITRATE ER 30 MG PO TB24: 30.0000 mg | ORAL_TABLET | Freq: Every day | ORAL | 1 refills | Status: DC

## 2018-11-25 MED ORDER — HYDRALAZINE HCL 50 MG PO TABS: 50.0000 mg | ORAL_TABLET | Freq: Three times a day (TID) | ORAL | 1 refills | Status: DC

## 2018-11-25 NOTE — Addendum Note (Signed)
Addended by: Wilhelmina McardleKENNEDY, Lima Chillemi R on: 11/25/2018 01:44 PM   Modules accepted: Orders

## 2018-11-25 NOTE — Telephone Encounter (Signed)
Pt called  Requesting refill on ....... called into [facility]  Pt. Call back # is [phone]   Amlodipine  10 mg  Olmesart 40mg   Sosorvid 30mg   Levothyroxine  150 mg

## 2018-11-25 NOTE — Addendum Note (Signed)
Addended by: Lonna CobbUSSELL, Fitzpatrick Alberico L on: 11/25/2018 01:36 PM   Modules accepted: Orders

## 2018-12-02 ENCOUNTER — Other Ambulatory Visit: Payer: Self-pay | Admitting: Internal Medicine

## 2018-12-02 ENCOUNTER — Ambulatory Visit
Admission: RE | Admit: 2018-12-02 | Discharge: 2018-12-02 | Disposition: A | Discharge status: Home / Self Care | Payer: 59 | Source: Ambulatory Visit | Attending: Nurse Practitioner | Admitting: Nurse Practitioner

## 2018-12-02 ENCOUNTER — Encounter: Payer: Self-pay | Admitting: Nurse Practitioner

## 2018-12-02 ENCOUNTER — Ambulatory Visit (INDEPENDENT_AMBULATORY_CARE_PROVIDER_SITE_OTHER): Payer: 59 | Admitting: Nurse Practitioner

## 2018-12-02 VITALS — BP 142/81 | HR 100 | Temp 99.1°F | Resp 16 | Ht 72.0 in | Wt 300.0 lb

## 2018-12-02 DIAGNOSIS — R509 Fever, unspecified: Secondary | ICD-10-CM | POA: Insufficient documentation

## 2018-12-02 DIAGNOSIS — R05 Cough: Secondary | ICD-10-CM

## 2018-12-02 DIAGNOSIS — R059 Cough, unspecified: Secondary | ICD-10-CM

## 2018-12-02 DIAGNOSIS — G63 Polyneuropathy in diseases classified elsewhere: Secondary | ICD-10-CM | POA: Diagnosis not present

## 2018-12-02 DIAGNOSIS — I10 Essential (primary) hypertension: Secondary | ICD-10-CM

## 2018-12-02 DIAGNOSIS — M329 Systemic lupus erythematosus, unspecified: Secondary | ICD-10-CM | POA: Diagnosis not present

## 2018-12-02 DIAGNOSIS — J189 Pneumonia, unspecified organism: Secondary | ICD-10-CM

## 2018-12-02 DIAGNOSIS — E890 Postprocedural hypothyroidism: Secondary | ICD-10-CM

## 2018-12-02 DIAGNOSIS — J181 Lobar pneumonia, unspecified organism: Secondary | ICD-10-CM | POA: Diagnosis not present

## 2018-12-02 MED ORDER — AZITHROMYCIN 250 MG PO TABS: ORAL_TABLET | ORAL | 0 refills | Status: DC

## 2018-12-02 MED ORDER — CEFTRIAXONE SODIUM 1 G IJ SOLR
1.0000 g | Freq: Once | INTRAMUSCULAR | Status: AC
  Administered 2018-12-03: 1 g via INTRAMUSCULAR

## 2018-12-02 NOTE — Patient Instructions (Addendum)
Oscar AbbotAndre Plagge,   Thank you for coming in to clinic today.  1. It sounds like you have a Upper Respiratory Virus - this will most likely run it's course in 7 to 10 days. Recommend good hand washing. - Continue anti-histamine loratadine or cetirizine 10mg  daily, also can use Flonase 2 sprays each nostril daily for up to 4-6 weeks - If congestion is worse, start OTC Mucinex (or may try Mucinex-DM for cough) up to 7-10 days then stop.  Take instead of Nyquil.  Avoid all OTC decongestants. - Drink plenty of fluids to improve congestion - You may try over the counter Nasal Saline spray (Simply Saline, Ocean Spray) as needed to reduce congestion. - Drink warm herbal tea with honey for sore throat. - Start taking Tylenol extra strength 1 to 2 tablets every 6-8 hours for aches or fever/chills for next few days as needed.  Do not take more than 3,000 mg in 24 hours from all medicines.  May take Ibuprofen as well if tolerated 200-400mg  every 8 hours as needed.  If symptoms significantly worsening with persistent fevers/chills despite tylenol/ibpurofen, nausea, vomiting unable to tolerate food/fluids or medicine, body aches, or shortness of breath, sinus pain pressure or worsening productive cough, then follow-up for re-evaluation, may seek more immediate care at Urgent Care or ED if more concerned for emergency.  2. Chest Xray to rule out pneumonia.  Please schedule a follow-up appointment with Wilhelmina McardleLauren Ahmar Pickrell, AGNP. Return in 7 days if symptoms worsen or fail to improve for your cold.  If you have any other questions or concerns, please feel free to call the clinic or send a message through MyChart. You may also schedule an earlier appointment if necessary.  You will receive a survey after today's visit either digitally by e-mail or paper by [employer] mail. Your experiences and feedback matter to us.  Please respond so we know how we are doing as we provide care for you.   Wilhelmina McardleLauren Waneta Fitting, DNP, AGNP-BC Adult  Gerontology Nurse Practitioner [facility], [facility]

## 2018-12-02 NOTE — Progress Notes (Signed)
Subjective:    Patient ID: Oscar Jones, male    DOB: [birth year], 47 y.o.   MRN: [id number]  Oscar Abbotndre Bentley is a 47 y.o. male presenting on 12/02/2018 for Cold Exposure (cough, sneezing, chills ,HA, bodyache, HA can't use Nyquil with HTN onset 3 days fever 103 F)   HPI URI  Daughter with cold,  has had fever, chills, or sweats.    Has chills today, but is getting better slowly and believes he will not need any additional medication.   - Is taking Nyquil  RIGHT Foot pain Patient has known callus vs plantar wart on R foot > 2.5 cm round. He regularly has had removal of tissue every 3 months with podiatry in [place].  Has used mechanical debridement at home in absence of podiatry to date.   - Currently has severe RIGHT foot pain that returns within 1 month of debridement.  OTC medications have not been useful to date. - Patient requests referral to podiatry today  Social History   Tobacco Use  . Smoking status: Current Every Day Smoker    Packs/day: 0.50    Types: Cigarettes  . Smokeless tobacco: Current User  Substance Use Topics  . Alcohol use: Not Currently    Alcohol/week: 2.0 standard drinks    Types: 2 Cans of beer per week    Comment: 2 x 32 oz cans weekend, pint vodka every 3 weeks  . Drug use: No    Review of Systems Per HPI unless specifically indicated above     Objective:    BP (!) 142/81   Pulse 100   Temp 99.1 F (37.3 C) (Oral)   Resp 16   Ht 6' (1.829 m)   Wt 300 lb (136.1 kg)   SpO2 100%   BMI 40.69 kg/m   Wt Readings from Last 3 Encounters:  12/02/18 300 lb (136.1 kg)  10/20/18 (!) 308 lb 9.6 oz (140 kg)  09/07/18 (!) 306 lb (138.8 kg)    Physical Exam Vitals signs reviewed.  Constitutional:      General: He is not in acute distress.    Appearance: He is well-developed.  HENT:     Head: Normocephalic and atraumatic.  Cardiovascular:     Rate and Rhythm: Normal rate and regular rhythm.     Pulses:          Radial pulses are 2+ on the  right side and 2+ on the left side.       Posterior tibial pulses are 1+ on the right side and 1+ on the left side.     Heart sounds: Normal heart sounds, S1 normal and S2 normal.  Pulmonary:     Effort: Pulmonary effort is normal. No respiratory distress.     Breath sounds: Normal breath sounds and air entry.  Musculoskeletal:     Right lower leg: No edema.     Left lower leg: No edema.       Feet:  Skin:    General: Skin is warm and dry.     Capillary Refill: Capillary refill takes less than 2 seconds.  Neurological:     Mental Status: He is alert and oriented to person, place, and time.  Psychiatric:        Attention and Perception: Attention normal.        Mood and Affect: Mood and affect normal.        Behavior: Behavior normal. Behavior is cooperative.    Results for orders placed or  performed in visit on 10/20/18  Hemoglobin A1c  Result Value Ref Range   Hgb A1c MFr Bld 6.0 (H) <5.7 % of total Hgb   Mean Plasma Glucose 126 (calc)   eAG (mmol/L) 7.0 (calc)  Lipid panel  Result Value Ref Range   Cholesterol 254 (H) <200 mg/dL   HDL 72 >45>40 mg/dL   Triglycerides 73 <409<150 mg/dL   LDL Cholesterol (Calc) 165 (H) mg/dL (calc)   Total CHOL/HDL Ratio 3.5 <5.0 (calc)   Non-HDL Cholesterol (Calc) 182 (H) <130 mg/dL (calc)  PTH, Intact and Calcium  Result Value Ref Range   PTH 58 14 - 64 pg/mL   Calcium 9.4 8.6 - 10.3 mg/dL  TSH + free T4  Result Value Ref Range   TSH W/REFLEX TO FT4 18.22 (H) 0.40 - 4.50 mIU/L  VITAMIN D 25 Hydroxy (Vit-D Deficiency, Fractures)  Result Value Ref Range   Vit D, 25-Hydroxy 21 (L) 30 - 100 ng/mL  T4, free  Result Value Ref Range   Free T4 1.2 0.8 - 1.8 ng/dL      Assessment & Plan:   Problem List Items Addressed This Visit      Cardiovascular and Mediastinum   Accelerated hypertension Patient requests refills today. Refills provided.   Relevant Medications   olmesartan (BENICAR) 40 MG tablet   isosorbide mononitrate (IMDUR) 30 MG 24  hr tablet     Endocrine   Other specified hypothyroidism Patient requests refills today. Refills provided.   Relevant Medications   levothyroxine (SYNTHROID, LEVOTHROID) 150 MCG tablet    Other Visit Diagnoses    Cough with fever    -  Primary Acute illness. Fever responsive to NSAIDs and tylenol.  Symptoms worsening. Consistent with viral illness x 3 days with known sick contacts.  Patient with shortness of breath and fever concerning for bronchitis vs CAP.  No focal infections of ears, nose, throat.  Plan: 1. Reassurance, likely self-limited with cough lasting up to few weeks, but need CXR today to rule out pneumonia given ongoing fevers. - CAP confirmed on xray. - Start Mucinex-DM OTC up to 7-10 days then stop - Start tessalon perles 100 mg every 12 hours as needed for cough. -START azithromycin 250 mg tablet. Take 2 tablets today.  Then, take 1 tablet daily for the next 4 days and stop. - 1g IM Rocephin in clinic 12/27 2. Supportive care with nasal saline, warm herbal tea with honey, 3. Improve hydration 4. Tylenol / Motrin PRN fevers 5. 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  Referral podiatry placed.   Relevant Orders   Ambulatory referral to Podiatry   Lupus [facility](HCC)     See AP polyneuropathy.l   Relevant Orders   Ambulatory referral to Podiatry   Ambulatory referral to Rheumatology   Community acquired pneumonia of left lower lobe of lung (HCC)     See AP above cough with fever.   Relevant Medications   azithromycin (ZITHROMAX) 250 MG tablet   cefTRIAXone (ROCEPHIN) injection 1 g (Completed)      Meds ordered this encounter  Medications  . azithromycin (ZITHROMAX) 250 MG tablet    Sig: Take one tablet twice today.  Then, take 1 tablet daily for 4 days.    Dispense:  6 tablet    Refill:  0    Order Specific Question:  Supervising  Provider    Answer:   Smitty Cords [[id number]]  . cefTRIAXone (ROCEPHIN) injection 1 g  . olmesartan (BENICAR) 40 MG tablet    Sig: Take 1 tablet (40 mg total) by mouth daily.    Dispense:  90 tablet    Refill:  1  . levothyroxine (SYNTHROID, LEVOTHROID) 150 MCG tablet    Sig: Take 1 tablet (150 mcg total) by mouth daily before breakfast.    Dispense:  90 tablet    Refill:  1  . isosorbide mononitrate (IMDUR) 30 MG 24 hr tablet    Sig: Take 1 tablet (30 mg total) by mouth daily.    Dispense:  90 tablet    Refill:  1   Follow up plan: Return in 7 days if symptoms worsen or fail to improve for your cold.  Wilhelmina Mcardle, DNP, AGPCNP-BC Adult Gerontology Primary Care Nurse Practitioner [facility] 12/02/2018, 11:15 AM

## 2018-12-03 ENCOUNTER — Ambulatory Visit: Payer: 59

## 2018-12-03 DIAGNOSIS — J181 Lobar pneumonia, unspecified organism: Secondary | ICD-10-CM | POA: Diagnosis not present

## 2018-12-03 DIAGNOSIS — J189 Pneumonia, unspecified organism: Secondary | ICD-10-CM

## 2018-12-03 NOTE — Progress Notes (Signed)
The pt came in the office for a  cefTRIAXone (ROCEPHIN) injection 1 g : Dose 1 g : Intramuscular     The injection was administered in his Left glute without any complications.

## 2018-12-06 ENCOUNTER — Ambulatory Visit: Payer: 59 | Admitting: Podiatry

## 2018-12-06 ENCOUNTER — Encounter: Payer: Self-pay | Admitting: *Deleted

## 2018-12-06 ENCOUNTER — Encounter: Payer: Self-pay | Admitting: Podiatry

## 2018-12-06 VITALS — BP 136/84 | HR 97

## 2018-12-06 DIAGNOSIS — M2142 Flat foot [pes planus] (acquired), left foot: Secondary | ICD-10-CM | POA: Diagnosis not present

## 2018-12-06 DIAGNOSIS — M2141 Flat foot [pes planus] (acquired), right foot: Secondary | ICD-10-CM | POA: Diagnosis not present

## 2018-12-06 DIAGNOSIS — Q828 Other specified congenital malformations of skin: Secondary | ICD-10-CM

## 2018-12-07 ENCOUNTER — Encounter: Payer: Self-pay | Admitting: Nurse Practitioner

## 2018-12-07 MED ORDER — ISOSORBIDE MONONITRATE ER 30 MG PO TB24: 30.0000 mg | ORAL_TABLET | Freq: Every day | ORAL | 1 refills | Status: DC

## 2018-12-07 MED ORDER — LEVOTHYROXINE SODIUM 150 MCG PO TABS: 150.0000 ug | ORAL_TABLET | Freq: Every day | ORAL | 1 refills | Status: DC

## 2018-12-07 MED ORDER — OLMESARTAN MEDOXOMIL 40 MG PO TABS: 40.0000 mg | ORAL_TABLET | Freq: Every day | ORAL | 1 refills | Status: DC

## 2018-12-07 NOTE — Progress Notes (Signed)
This patient presents to the office with chief complaint of severe pain noted from a skin lesion on the bottom of his right foot.  He says that this severe pain has been present for approximately 3 to 4 months.  He states he has pain walking and wearing his shoes.  He states that he has had the skin lesion for years and was treated with debridement in [place].  He denies any history of foreign body penetration.  He says he frequently works on cutting the callus himself at home.  He always states that the callus then returns and causes continual pain.  He presents the office today for an evaluation and treatment of this painful skin lesion.    General Appearance  Alert, conversant and in no acute stress.  Vascular  Dorsalis pedis and posterior tibial  pulses are palpable  bilaterally.  Capillary return is within normal limits  bilaterally. Temperature is within normal limits  bilaterally.  Neurologic  Senn-Weinstein monofilament wire test within normal limits  bilaterally. Muscle power within normal limits bilaterally.  Nails Thick disfigured discolored nails with subungual debris  from hallux to fifth toes bilaterally. No evidence of bacterial infection or drainage bilaterally.  Orthopedic  No limitations of motion  feet .  No crepitus or effusions noted.  No bony pathology or digital deformities noted. Pes planus foot type.  Skin  normotropic skin  bilaterally.  No signs of infections or ulcers noted.  Patient has a well-defined skin lesion noted on the plantar lateral aspect of his right midfoot.  There appears to be white scar tissue noted at the site of the skin lesion. No signs of redness or swelling or inflammation noted.  Skin lesion right foot.  IE.  Debride skin lesion.  Patient states that this callus seems to continually return even after debridement of the skin lesion.  After discussing this condition with this patient he states that he would prefer having the skin lesion cut out.  I  felt this patient would benefit from an excisional biopsy for the removal of the painful skin lesion.  Patient was referred to Dr. Logan BoresEvans for his evaluation and determination for surgical intervention.  He is to return to the office in 10 weeks for continued preventative foot care services.  As patient was leaving he requested nail care which I told him I will gladly perform at his next visit.   Helane GuntherGregory Aleksa Catterton DPM

## 2018-12-13 ENCOUNTER — Telehealth: Payer: Self-pay | Admitting: Nurse Practitioner

## 2018-12-13 NOTE — Telephone Encounter (Signed)
Patient will need either ER visit or urgent GI referral.  Can you see if GI can see him for outpatient workup/colonoscopy in next 1-2 days.  If not, he will need to go to ER.  We will be of little help in primary care because we won't be able to treat this bleed.

## 2018-12-13 NOTE — Telephone Encounter (Signed)
Pt asked for a phone call about some bleeding he is having [phone]

## 2018-12-13 NOTE — Telephone Encounter (Signed)
The pt called complaining of dark black stool on Saturday and now he noticing  bright red blood with loose stool x 1 day. The pt states no pain with bm and no history of hemorrhoids.

## 2018-12-13 NOTE — Telephone Encounter (Signed)
The pt was notified and she informed me that he will go to the ER.

## 2018-12-14 ENCOUNTER — Telehealth: Payer: Self-pay | Admitting: Nurse Practitioner

## 2018-12-14 DIAGNOSIS — J189 Pneumonia, unspecified organism: Secondary | ICD-10-CM

## 2018-12-14 DIAGNOSIS — J181 Lobar pneumonia, unspecified organism: Principal | ICD-10-CM

## 2018-12-14 MED ORDER — ALBUTEROL SULFATE HFA 108 (90 BASE) MCG/ACT IN AERS: 1.0000 | INHALATION_SPRAY | Freq: Four times a day (QID) | RESPIRATORY_TRACT | 2 refills | Status: DC | PRN

## 2018-12-14 NOTE — Telephone Encounter (Signed)
Pt said bleeding has stopped but he still has chills, cough.  He finished medication prescribed and still taking OTC drugs.  His call back number is [phone]

## 2018-12-14 NOTE — Telephone Encounter (Signed)
Cough can take several weeks after infection to completely go away. As long as all other symptoms are improving, patient may continue mucinex DM for cough.   - I will also start albuterol to take every 4-6 hours as needed for any shortness of breath and wheezing.   - Return to clinic for re-evaluation in the next 2 weeks if any worsening occurs.

## 2018-12-15 NOTE — Telephone Encounter (Signed)
The pt was notified. No questions or concerns. 

## 2018-12-16 ENCOUNTER — Telehealth: Payer: Self-pay | Admitting: Nurse Practitioner

## 2018-12-16 NOTE — Telephone Encounter (Signed)
Pt said the bleeding started back and asked which hospital to go to [phone]

## 2018-12-16 NOTE — Telephone Encounter (Signed)
The pt was informed to go to whatever hospital is closer to him. He verbalize understanding, no questions or concerns.

## 2018-12-21 ENCOUNTER — Ambulatory Visit: Payer: 59 | Admitting: Podiatry

## 2018-12-23 LAB — HM COLONOSCOPY

## 2018-12-28 ENCOUNTER — Other Ambulatory Visit: Payer: Self-pay | Admitting: Podiatry

## 2018-12-28 ENCOUNTER — Ambulatory Visit: Payer: 59 | Admitting: Podiatry

## 2018-12-28 ENCOUNTER — Ambulatory Visit (INDEPENDENT_AMBULATORY_CARE_PROVIDER_SITE_OTHER): Payer: 59

## 2018-12-28 ENCOUNTER — Encounter: Payer: Self-pay | Admitting: Podiatry

## 2018-12-28 DIAGNOSIS — M7671 Peroneal tendinitis, right leg: Secondary | ICD-10-CM | POA: Diagnosis not present

## 2018-12-28 DIAGNOSIS — L989 Disorder of the skin and subcutaneous tissue, unspecified: Secondary | ICD-10-CM

## 2018-12-28 DIAGNOSIS — M722 Plantar fascial fibromatosis: Secondary | ICD-10-CM

## 2018-12-28 DIAGNOSIS — M79671 Pain in right foot: Secondary | ICD-10-CM

## 2018-12-28 MED ORDER — MELOXICAM 15 MG PO TABS: 15.0000 mg | ORAL_TABLET | Freq: Every day | ORAL | 1 refills | Status: AC

## 2018-12-28 MED ORDER — METHYLPREDNISOLONE 4 MG PO TBPK: ORAL_TABLET | ORAL | 0 refills | Status: DC

## 2019-01-04 NOTE — Progress Notes (Signed)
   Subjective: 48 year old male presenting today for follow up evaluation of a painful callus lesion noted to the right foot. Applying pressure to the area increases the pain. He has not done anything for treatment at home.  He also notes pain to the plantar aspect of the right arch that began a few weeks ago. He reports associated swelling. Standing and walking increases the pain. He has not done anything for treatment. Patient is here for further evaluation and treatment.   Past Medical History:  Diagnosis Date  . Anxiety   . Discoid lupus   . HLD (hyperlipidemia)   . Hypertension   . OSA (obstructive sleep apnea)      Objective:  Physical Exam General: Alert and oriented x3 in no acute distress  Dermatology: Hyperkeratotic lesion present on the right foot. Pain on palpation with a central nucleated core noted. Skin is warm, dry and supple bilateral lower extremities. Negative for open lesions or macerations.  Vascular: Palpable pedal pulses bilaterally. No edema or erythema noted. Capillary refill within normal limits.  Neurological: Epicritic and protective threshold grossly intact bilaterally.   Musculoskeletal Exam: Pain on palpation at the keratotic lesion noted. Pain with palpation to the peroneal tendon of the right foot. Range of motion within normal limits bilateral. Muscle strength 5/5 in all groups bilateral.  Radiographic Exam:  Normal osseous mineralization. Joint spaces preserved. No fracture/dislocation/boney destruction.     Assessment: 1. Peroneal tendinitis right  2. Porokeratosis right    Plan of Care:  1. Patient evaluated. X-Rays reviewed.  2. Excisional debridement of keratoic lesion using a chisel blade was performed without incident.  3. Dressed area with light dressing. 4. Prescription for Medrol Dose Pak provided to patient. 5. Prescription for Meloxicam provided to patient. 6. Recommended good shoe gear.  7. Patient is to return to the clinic  in 4 weeks.  Patient does apartment maintenance.    Felecia Shelling, DPM [facility]  Dr. Felecia Shelling, DPM    [address]                                        [place], [place] [address]                Office [phone]  Fax [phone]

## 2019-01-21 ENCOUNTER — Other Ambulatory Visit: Payer: Self-pay

## 2019-01-21 ENCOUNTER — Ambulatory Visit (INDEPENDENT_AMBULATORY_CARE_PROVIDER_SITE_OTHER): Payer: 59 | Admitting: Nurse Practitioner

## 2019-01-21 ENCOUNTER — Ambulatory Visit: Payer: 59 | Admitting: Nurse Practitioner

## 2019-01-21 VITALS — BP 156/104 | HR 80 | Temp 98.5°F | Ht 72.0 in | Wt 300.6 lb

## 2019-01-21 DIAGNOSIS — I10 Essential (primary) hypertension: Secondary | ICD-10-CM

## 2019-01-21 DIAGNOSIS — H538 Other visual disturbances: Secondary | ICD-10-CM | POA: Diagnosis not present

## 2019-01-21 MED ORDER — CLONIDINE HCL 0.1 MG PO TABS: 0.1000 mg | ORAL_TABLET | Freq: Two times a day (BID) | ORAL | 0 refills | Status: DC

## 2019-01-21 NOTE — Patient Instructions (Addendum)
Oscar Jones,   Thank you for coming in to clinic today.  1.  START clonidine 0.1 mg tablet twice daily for 7-10 days.  Continue if needed.   - GOAL blood pressure is <  130/80.  Acceptable to have < 140/90 - Call clinic with new medication if less than 100/60.   - Call clinic in about 1 week with 2-3 readings.    2. Call [facility]  [phone]  to schedule your appointment.  3. Get labs in about 7 days.   4. Ask dermatology.    Please schedule a follow-up appointment with Wilhelmina Mcardle, AGNP. Return in about 6 weeks (around 03/04/2019) for hypertension.  If you have any other questions or concerns, please feel free to call the clinic or send a message through MyChart. You may also schedule an earlier appointment if necessary.  You will receive a survey after today's visit either digitally by e-mail or paper by [employer]. Your experiences and feedback matter to [place].  Please respond so we know how we are doing as we provide care for you.   Wilhelmina Mcardle, DNP, AGNP-BC Adult Gerontology Nurse Practitioner [facility]

## 2019-01-21 NOTE — Progress Notes (Signed)
Subjective:    Patient ID: Oscar Jones, male    DOB: 01/23/1971, 48 y.o.   MRN: [id number]  Oscar Jones is a 48 y.o. male presenting on 01/21/2019 for Hypertension and Blurred Vision (intermittent x 3-4 weeks )   HPI Hypertension - He is not checking BP at home or outside of clinic.    - Current medications: amlodipine 10 mg once daily, hydralazine 100 mg tid, isosorbide mononitrate 30 mg daily, olmesartan 40 mg once daily, tolerating well without side effects  - Patient is taking hydralazine q8 hours.  Patient is taking one in am 5-6 am at noon, and bedtime around 9pm - He is not currently symptomatic. - Pt denies headache, lightheadedness, dizziness, changes in vision, chest tightness/pressure, palpitations, leg swelling, sudden loss of speech or loss of consciousness. - He  reports an exercise routine that includes walking around apartment complex, taking stairs, 3-4 days per week. - His diet is high in salt, high in fat, and high in carbohydrates.   Blurry vision Is having this intermittently over the past x 3-4 weeks.  Patient never had eye exam before starting Plaquenil and is not following up regarding the Plaquenil with Dr. Tresa Moore  Foot pain Patient has tendinitis - Steroid pack completed about 1.5 weeks ago.   Abscess buttock (LEFT) Patient had surgery in 2008 on buttock and removed/drained an abscess.  3 weeks ago, noticed cyst is back.  Used heating pad, erupted and kept covered.  Had no problems for 1 week, but is coming back.    Social History   Tobacco Use  . Smoking status: Current Every Day Smoker    Packs/day: 0.50    Types: Cigarettes  . Smokeless tobacco: Current User  Substance Use Topics  . Alcohol use: Not Currently    Alcohol/week: 2.0 standard drinks    Types: 2 Cans of beer per week    Comment: 2 x 32 oz cans weekend, pint vodka every 3 weeks  . Drug use: No    Review of Systems Per HPI unless specifically indicated above       Objective:    BP (!) 156/104 (BP Location: Right Arm, Patient Position: Sitting, Cuff Size: Large)   Pulse 80   Temp 98.5 F (36.9 C) (Oral)   Ht 6' (1.829 m)   Wt (!) 300 lb 9.6 oz (136.4 kg)   BMI 40.77 kg/m   Wt Readings from Last 3 Encounters:  01/21/19 (!) 300 lb 9.6 oz (136.4 kg)  12/02/18 300 lb (136.1 kg)  10/20/18 (!) 308 lb 9.6 oz (140 kg)    Physical Exam Vitals signs reviewed.  Constitutional:      General: He is awake. He is not in acute distress.    Appearance: He is well-developed.  HENT:     Head: Normocephalic and atraumatic.  Neck:     Musculoskeletal: Normal range of motion and neck supple.     Vascular: No carotid bruit.  Cardiovascular:     Rate and Rhythm: Normal rate and regular rhythm.     Pulses:          Radial pulses are 2+ on the right side and 2+ on the left side.       Posterior tibial pulses are 1+ on the right side and 1+ on the left side.     Heart sounds: Normal heart sounds, S1 normal and S2 normal.  Pulmonary:     Effort: Pulmonary effort is normal. No respiratory distress.  Breath sounds: Normal breath sounds and air entry.  Skin:    General: Skin is warm and dry.     Capillary Refill: Capillary refill takes less than 2 seconds.  Neurological:     General: No focal deficit present.     Mental Status: He is alert and oriented to person, place, and time. Mental status is at baseline.     Cranial Nerves: No cranial nerve deficit.     Sensory: No sensory deficit.     Motor: No weakness.     Gait: Gait normal.     Deep Tendon Reflexes: Reflexes are normal and symmetric. Reflexes normal.  Psychiatric:        Attention and Perception: Attention normal.        Mood and Affect: Mood and affect normal.        Behavior: Behavior normal. Behavior is cooperative.        Thought Content: Thought content normal.        Judgment: Judgment normal.     Results for orders placed or performed in visit on 01/12/19  HM COLONOSCOPY  Result  Value Ref Range   HM Colonoscopy See Report (in chart) See Report (in chart), Patient Reported      Assessment & Plan:   Problem List Items Addressed This Visit      Cardiovascular and Mediastinum   Accelerated hypertension - Primary UNCONTROLLED hypertension.  BP goal < 130/80.  Pt is not currently working on lifestyle modifications.  Taking medications tolerating well without side effects.  Complications: CKD  Plan: 1. Continue taking current BP medications - ADD clonidine 0.1 mg tab bid x 7-10 days.  Continue if needed - REVIEWED BP goals for treatment. 2. Obtain labs 1 week  3. Encouraged heart healthy diet and increasing exercise to 30 minutes most days of the week. 4. Check BP 1-2 x per week at home, keep log, and bring to clinic at next appointment. 5. Follow up 6 weeks months.     Relevant Medications   hydrALAZINE (APRESOLINE) 100 MG tablet   cloNIDine (CATAPRES) 0.1 MG tablet   Other Relevant Orders   COMPLETE METABOLIC PANEL WITH GFR    Other Visit Diagnoses    Blurry vision     Possible relationship to hypertension, steroid, or prediabetes.    Plan: 1. Continue regular activity. 2. Continue optometry visit 3. Recheck labs with A1c, CMP today. 4. Improve BP control. 5. Follow-up 6 weeks or sooner if needed.   Relevant Orders   Hemoglobin A1c      Meds ordered this encounter  Medications  . cloNIDine (CATAPRES) 0.1 MG tablet    Sig: Take 1 tablet (0.1 mg total) by mouth 2 (two) times daily.    Dispense:  60 tablet    Refill:  0    Order Specific Question:   Supervising Provider    Answer:   Smitty Cords [[id number]]    Follow up plan: Return in about 6 weeks (around 03/04/2019) for hypertension.  Wilhelmina Mcardle, DNP, AGPCNP-BC Adult Gerontology Primary Care Nurse Practitioner [facility] 01/21/2019, 4:11 PM

## 2019-01-24 ENCOUNTER — Encounter: Payer: Self-pay | Admitting: Nurse Practitioner

## 2019-01-31 ENCOUNTER — Other Ambulatory Visit: Payer: 59

## 2019-02-01 ENCOUNTER — Encounter: Payer: Self-pay | Admitting: Podiatry

## 2019-02-01 ENCOUNTER — Ambulatory Visit (INDEPENDENT_AMBULATORY_CARE_PROVIDER_SITE_OTHER): Payer: 59 | Admitting: Podiatry

## 2019-02-01 DIAGNOSIS — M7671 Peroneal tendinitis, right leg: Secondary | ICD-10-CM | POA: Diagnosis not present

## 2019-02-01 DIAGNOSIS — L989 Disorder of the skin and subcutaneous tissue, unspecified: Secondary | ICD-10-CM

## 2019-02-01 LAB — HEMOGLOBIN A1C
Hgb A1c MFr Bld: 5.8 % of total Hgb — ABNORMAL HIGH (ref <5.7)
Mean Plasma Glucose: 120 (calc)
eAG (mmol/L): 6.6 (calc)

## 2019-02-01 LAB — COMPLETE METABOLIC PANEL WITH GFR
AG Ratio: 1.4 (calc) (ref 1.0–2.5)
ALT: 16 U/L (ref 9–46)
AST: 17 U/L (ref 10–40)
Albumin: 3.7 g/dL (ref 3.6–5.1)
Alkaline phosphatase (APISO): 58 U/L (ref 36–130)
BUN/Creatinine Ratio: 10 (calc) (ref 6–22)
BUN: 16 mg/dL (ref 7–25)
CO2: 23 mmol/L (ref 20–32)
Calcium: 9.1 mg/dL (ref 8.6–10.3)
Chloride: 107 mmol/L (ref 98–110)
Creat: 1.58 mg/dL — ABNORMAL HIGH (ref 0.60–1.35)
GFR, Est African American: 59 mL/min/{1.73_m2} — ABNORMAL LOW (ref >=60)
GFR, Est Non African American: 51 mL/min/{1.73_m2} — ABNORMAL LOW (ref >=60)
Globulin: 2.7 g/dL (calc) (ref 1.9–3.7)
Glucose, Bld: 81 mg/dL (ref 65–99)
Potassium: 4.4 mmol/L (ref 3.5–5.3)
Sodium: 139 mmol/L (ref 135–146)
Total Bilirubin: 0.4 mg/dL (ref 0.2–1.2)
Total Protein: 6.4 g/dL (ref 6.1–8.1)

## 2019-02-04 ENCOUNTER — Telehealth: Payer: Self-pay | Admitting: Nurse Practitioner

## 2019-02-04 NOTE — Telephone Encounter (Signed)
Kidney function is improving on this medication. He definitely needs to continue seeing Rheumatology and taking his current medications.  He may need to see Dr. Renard Matter again to get refills before transferring care to [facility]

## 2019-02-04 NOTE — Telephone Encounter (Signed)
Pt called wanted to know if he still need to take his medication for Lupus( could not give name of medication) said that he was going to  Stop taken it until you call hm back with an answer. Pt call back # is  [phone]

## 2019-02-04 NOTE — Telephone Encounter (Signed)
The pt was given recommendation. He states that he is not going to take the medication, because it's been determine that he doesn't have internal lupus. 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 The pt states that he requested that his lab results from his Dermatology & Nephrology  be faxed over to our office. I notified the patient that we have not receive them yet, but I will look out for them.

## 2019-02-04 NOTE — Progress Notes (Signed)
   Subjective: 48 year old male presenting today for follow up evaluation of a painful callus lesion noted to the right foot as well as peroneal tendinitis of the right foot. He reports some continued intermittent pain but states it has improved. There are no modifying factors noted and he has been taking Meloxicam for treatment. He states some days are better than others. Patient is here for further evaluation and treatment.   Past Medical History:  Diagnosis Date  . Anxiety   . Discoid lupus   . HLD (hyperlipidemia)   . Hypertension   . OSA (obstructive sleep apnea)      Objective:  Physical Exam General: Alert and oriented x3 in no acute distress  Dermatology: Skin is warm, dry and supple bilateral lower extremities. Negative for open lesions or macerations.  Vascular: Palpable pedal pulses bilaterally. No edema or erythema noted. Capillary refill within normal limits.  Neurological: Epicritic and protective threshold grossly intact bilaterally.   Musculoskeletal Exam: Range of motion within normal limits bilateral. Muscle strength 5/5 in all groups bilateral.  Assessment: 1. Peroneal tendinitis right - resolved  2. Porokeratosis right - resolved    Plan of Care:  1. Patient evaluated.  2. Recommended custom orthotics from [place], Pedorthist.  3. Recommended good shoe gear.  4. Continue taking Meloxicam as needed.  5. Return to clinic as needed.   Patient does apartment maintenance.    Felecia Shelling, DPM [facility]  Dr. Felecia Shelling, DPM    [address]                                        [place], [place] [address]                Office [phone]  Fax [phone]

## 2019-02-04 NOTE — Telephone Encounter (Signed)
I continue to recommend he finish his workup and consultation with a rheumatologist, even if it is one final visit with Dr. Renard Matter to finish medications safely.  As previously discussed with Oscar Jones, we will not manage or make recommendations for his high risk medications prescribed by rheumatology. - Continue nephrology visits as scheduled for kidney improvement. Important to continue good blood pressure control. - Plan to follow-up with him as scheduled here.

## 2019-02-14 ENCOUNTER — Ambulatory Visit: Payer: 59 | Admitting: Podiatry

## 2019-02-16 ENCOUNTER — Ambulatory Visit (INDEPENDENT_AMBULATORY_CARE_PROVIDER_SITE_OTHER): Payer: 59 | Admitting: Orthotics

## 2019-02-16 DIAGNOSIS — M2142 Flat foot [pes planus] (acquired), left foot: Secondary | ICD-10-CM | POA: Diagnosis not present

## 2019-02-16 DIAGNOSIS — M7671 Peroneal tendinitis, right leg: Secondary | ICD-10-CM

## 2019-02-16 DIAGNOSIS — M2141 Flat foot [pes planus] (acquired), right foot: Secondary | ICD-10-CM

## 2019-02-16 NOTE — Progress Notes (Signed)
Patient came into today to be cast for Custom Foot Orthotics. Upon recommendation of [name] Patient presents with pes planus Goals are offloading 2nd right and taking pressure off peroneal tendons Plan vendor [name]

## 2019-02-18 ENCOUNTER — Other Ambulatory Visit: Payer: Self-pay

## 2019-02-18 DIAGNOSIS — I10 Essential (primary) hypertension: Secondary | ICD-10-CM

## 2019-02-18 MED ORDER — CLONIDINE HCL 0.1 MG PO TABS: 0.1000 mg | ORAL_TABLET | Freq: Two times a day (BID) | ORAL | 5 refills | Status: DC

## 2019-02-18 NOTE — Telephone Encounter (Signed)
Pharmacy sent a fax requesting refills. Please review. Thanks!  

## 2019-02-25 ENCOUNTER — Ambulatory Visit (INDEPENDENT_AMBULATORY_CARE_PROVIDER_SITE_OTHER): Payer: 59 | Admitting: Nurse Practitioner

## 2019-02-25 ENCOUNTER — Encounter: Payer: Self-pay | Admitting: Nurse Practitioner

## 2019-02-25 ENCOUNTER — Other Ambulatory Visit: Payer: Self-pay

## 2019-02-25 VITALS — BP 129/83 | HR 76 | Temp 97.8°F | Ht 72.0 in | Wt 298.6 lb

## 2019-02-25 DIAGNOSIS — E038 Other specified hypothyroidism: Secondary | ICD-10-CM | POA: Diagnosis not present

## 2019-02-25 DIAGNOSIS — I10 Essential (primary) hypertension: Secondary | ICD-10-CM | POA: Diagnosis not present

## 2019-02-25 DIAGNOSIS — I129 Hypertensive chronic kidney disease with stage 1 through stage 4 chronic kidney disease, or unspecified chronic kidney disease: Secondary | ICD-10-CM

## 2019-02-25 DIAGNOSIS — N183 Chronic kidney disease, stage 3 (moderate): Secondary | ICD-10-CM | POA: Diagnosis not present

## 2019-02-25 NOTE — Patient Instructions (Addendum)
Oscar Jones,   Thank you for coming in to clinic today.  1. SEPARATE your clonidine morning dose from your other BP meds.  Take at the end of breakfast or around 1 hour after breakfast.   -   If your chest pain continues, call clinic and we will change you to a patch that has a more steady release. - Take your pm clonidine dose   2. Complete follow-up about ? Systemic Lupus with either Dr. Renard Matter or [facility].   - Discuss methotrexate with your dermatologist.  Please schedule a follow-up appointment with Wilhelmina Mcardle, AGNP. Return in about 3 months (around 05/28/2019) for hypertension.  If you have any other questions or concerns, please feel free to call the clinic or send a message through MyChart. You may also schedule an earlier appointment if necessary.  You will receive a survey after today's visit either digitally by e-mail or paper by [employer]. Your experiences and feedback matter to [place].  Please respond so we know how we are doing as we provide care for you.   Wilhelmina Mcardle, DNP, AGNP-BC Adult Gerontology Nurse Practitioner [facility], [facility]

## 2019-02-25 NOTE — Progress Notes (Signed)
Subjective:    Patient ID: Oscar Jones, male    DOB: 08/23/1971, 48 y.o.   MRN: [id number]  Oscar Jones is a 48 y.o. male presenting on 02/25/2019 for Hypertension   HPI Hypertension - He is not checking BP at home or outside of clinic.    - Current medications: amlodipine 10 mg onc daily, olmesartan 40 mg once daily, Imdur 30 mg once daily, hydralazine 100 mg tid, clonidine 0.1 mg bid tolerating well without side effects except for new clonidine 0.1 mg tablet is causing very brief chest pain with morning doses (takes all other meds at the same time). He is not currently symptomatic.  - Pt denies headache, lightheadedness, dizziness, changes in vision, palpitations, leg swelling, sudden loss of speech or loss of consciousness. - He  reports no regular exercise routine. - His diet is high in salt, high in fat, and high in carbohydrates.   Hypothyroidism - Pt states he is taking his levothyroxine 150 nmcg in the am at least 1 hour before eating or drinking and taking other medicines.   - He is not currently symptomatic. 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  - He does not have any compressive symptoms to include difficulty swallowing, globus sensation, or difficulty breathing when lying flat.   Social History   Tobacco Use  . Smoking status: Current Every Day Smoker    Packs/day: 0.50    Types: Cigarettes  . Smokeless tobacco: Current User  Substance Use Topics  . Alcohol use: Yes    Alcohol/week: 2.0 standard drinks    Types: 2 Cans of beer per week    Comment: 2 x 32 oz cans weekend, pint vodka every 3 weeks  . Drug use: No    Review of Systems Per HPI unless specifically indicated above     Objective:    BP 129/83 (BP Location: Left Arm, Patient Position: Sitting, Cuff Size: Large)   Pulse 76   Temp 97.8 F (36.6 C)  (Oral)   Ht 6' (1.829 m)   Wt 298 lb 9.6 oz (135.4 kg)   SpO2 99%   BMI 40.50 kg/m   Wt Readings from Last 3 Encounters:  02/25/19 298 lb 9.6 oz (135.4 kg)  01/21/19 (!) 300 lb 9.6 oz (136.4 kg)  12/02/18 300 lb (136.1 kg)    Physical Exam Vitals signs reviewed.  Constitutional:      General: He is awake. He is not in acute distress.    Appearance: He is well-developed.  HENT:     Head: Normocephalic and atraumatic.  Neck:     Musculoskeletal: Full passive range of motion without pain, normal range of motion and neck supple.     Thyroid: No thyroid mass, thyromegaly or thyroid tenderness.     Vascular: No carotid bruit.  Cardiovascular:     Rate and Rhythm: Normal rate and regular rhythm.     Pulses:          Radial pulses are 2+ on the right side and 2+ on the left side.       Posterior tibial pulses are 1+ on the right side and 1+ on the left side.     Heart sounds: Normal heart sounds, S1 normal and S2 normal.  Pulmonary:     Effort: Pulmonary effort is normal. No respiratory distress.     Breath sounds: Normal breath  sounds and air entry.  Musculoskeletal:     Right lower leg: 1+ Edema present.     Left lower leg: 1+ Edema present.  Skin:    General: Skin is warm and dry.     Capillary Refill: Capillary refill takes less than 2 seconds.  Neurological:     General: No focal deficit present.     Mental Status: He is alert and oriented to person, place, and time. Mental status is at baseline.  Psychiatric:        Attention and Perception: Attention normal.        Mood and Affect: Mood and affect normal.        Behavior: Behavior normal. Behavior is cooperative.        Thought Content: Thought content normal.        Judgment: Judgment normal.    Results for orders placed or performed in visit on 01/21/19  COMPLETE METABOLIC PANEL WITH GFR  Result Value Ref Range   Glucose, Bld 81 65 - 99 mg/dL   BUN 16 7 - 25 mg/dL   Creat 8.67 (H) 6.72 - 1.35 mg/dL   GFR, Est  Non African American 51 (L) > OR = 60 mL/min/1.68m2   GFR, Est African American 59 (L) > OR = 60 mL/min/1.47m2   BUN/Creatinine Ratio 10 6 - 22 (calc)   Sodium 139 135 - 146 mmol/L   Potassium 4.4 3.5 - 5.3 mmol/L   Chloride 107 98 - 110 mmol/L   CO2 23 20 - 32 mmol/L   Calcium 9.1 8.6 - 10.3 mg/dL   Total Protein 6.4 6.1 - 8.1 g/dL   Albumin 3.7 3.6 - 5.1 g/dL   Globulin 2.7 1.9 - 3.7 g/dL (calc)   AG Ratio 1.4 1.0 - 2.5 (calc)   Total Bilirubin 0.4 0.2 - 1.2 mg/dL   Alkaline phosphatase (APISO) 58 36 - 130 U/L   AST 17 10 - 40 U/L   ALT 16 9 - 46 U/L  Hemoglobin A1c  Result Value Ref Range   Hgb A1c MFr Bld 5.8 (H) <5.7 % of total Hgb   Mean Plasma Glucose 120 (calc)   eAG (mmol/L) 6.6 (calc)      Assessment & Plan:   Problem List Items Addressed This Visit      Cardiovascular and Mediastinum   Accelerated hypertension Hypertensive kidney disease CKD stage III Currently controlled hypertension.  BP goal < 130/80.  Pt is not currently working on lifestyle modifications.  Taking medications tolerating well without side effects. Complication: CKD stage III (now stable).  Plan: 1. Continue taking all medications without changes as they are providing good control today. - Should stop clonidine if separating its dose does not resolve chest pain - patient to call clinic.   - Should consider carvedilol or other Bblocker if stopping.  Have also discussed with patient the need to seek Cardiology referral if having difficulty with control 2. Obtain labs 3-6 months - continue follow-up of kidney function with nephrology.  3. Encouraged heart healthy diet and increasing exercise to 30 minutes most days of the week. 4. Check BP 1-2 x per week at home, keep log, and bring to clinic at next appointment. 5. Follow up 3 months.       Endocrine   Other specified hypothyroidism - Primary Unkown status - no recent TSH check.Clinically stable today on exam.  Medications tolerated without side  effects.  Continue at current doses.  Refills provided.  Check labs today.  Followup 6 months.    Relevant Orders   TSH      Follow up plan: Return in about 3 months (around 05/28/2019) for hypertension.  Wilhelmina Mcardle, DNP, AGPCNP-BC Adult Gerontology Primary Care Nurse Practitioner [facility] 02/25/2019, 8:09 AM

## 2019-02-28 ENCOUNTER — Encounter: Payer: Self-pay | Admitting: Nurse Practitioner

## 2019-03-09 ENCOUNTER — Telehealth: Payer: Self-pay | Admitting: Nurse Practitioner

## 2019-03-09 ENCOUNTER — Other Ambulatory Visit: Payer: 59 | Admitting: Orthotics

## 2019-03-09 NOTE — Telephone Encounter (Signed)
Pt called requesting a note if for some reason he need to be out for his Lupus.He said that he have not been in contact with anyone with the virus. Pt  Call back # is [phone]

## 2019-03-10 NOTE — Telephone Encounter (Signed)
Patient is on immune system suppressants from dermatology and rheumatology.  If he needs special precautions for his work, his specialist needs to guide this.  Please ask patient to contact his specialists.

## 2019-03-10 NOTE — Telephone Encounter (Signed)
The pt was notified. He verbalize understanding.  

## 2019-05-20 ENCOUNTER — Other Ambulatory Visit: Payer: Self-pay | Admitting: Nurse Practitioner

## 2019-05-20 DIAGNOSIS — I10 Essential (primary) hypertension: Secondary | ICD-10-CM

## 2019-06-17 ENCOUNTER — Other Ambulatory Visit: Payer: Self-pay | Admitting: Nurse Practitioner

## 2019-06-17 DIAGNOSIS — E890 Postprocedural hypothyroidism: Secondary | ICD-10-CM

## 2019-07-19 ENCOUNTER — Other Ambulatory Visit: Payer: Self-pay

## 2019-07-19 ENCOUNTER — Ambulatory Visit: Payer: 59 | Admitting: Nurse Practitioner

## 2019-08-11 ENCOUNTER — Ambulatory Visit: Payer: 59 | Admitting: Nurse Practitioner

## 2019-08-22 ENCOUNTER — Encounter: Payer: Self-pay | Admitting: Nurse Practitioner

## 2019-08-22 ENCOUNTER — Other Ambulatory Visit: Payer: Self-pay

## 2019-08-22 ENCOUNTER — Ambulatory Visit: Payer: 59 | Admitting: Nurse Practitioner

## 2019-08-22 VITALS — BP 143/93 | HR 69 | Ht 72.0 in | Wt 307.0 lb

## 2019-08-22 DIAGNOSIS — I10 Essential (primary) hypertension: Secondary | ICD-10-CM

## 2019-08-22 DIAGNOSIS — E782 Mixed hyperlipidemia: Secondary | ICD-10-CM | POA: Diagnosis not present

## 2019-08-22 DIAGNOSIS — G4733 Obstructive sleep apnea (adult) (pediatric): Secondary | ICD-10-CM | POA: Diagnosis not present

## 2019-08-22 DIAGNOSIS — E038 Other specified hypothyroidism: Secondary | ICD-10-CM

## 2019-08-22 MED ORDER — AMLODIPINE BESYLATE 10 MG PO TABS: 10.0000 mg | ORAL_TABLET | Freq: Every day | ORAL | 0 refills | Status: DC

## 2019-08-22 NOTE — Progress Notes (Signed)
Subjective:    Patient ID: Oscar Jones, male    DOB: 02-27-1971, 48 y.o.   MRN: [id number]  Oscar Jones is a 49 y.o. male presenting on 08/22/2019 for Hypertension and Foot Pain (Right foot pain, pain around the ankle and numbness in the toes x 6 mths)  HPI Foot Pain RIGHT  Patient has had pain in RIGHT foot and ankle for 6 months. Numbness is also associated with this.   - Patient thought he had possible bone spurs, so he self-referred to Dr. Windy Carina ([facility]).  He was provided with orthotics for pes planus bilaterally and for peroneal tendinitis, which didn't help pain with regular use over 2 months period of time.  Patient denies 'pins and needles' like pain.  Toes sometimes numb.  Patient perceives no back pain/pinched nerves. Numbness is associated with throbbing pain occasionally.  Numbness does resolve intermittently. - Steroid shots from podiatry also not effective - Xrays have been done and were negative. - No significant ankle injury in past.   - No diabetes ID'd in past screenings - Numbness only affects lateral toes  Hypertension - He is not checking BP at home or outside of clinic.    - Current medications: amlodipine 10 mg daily, carvedilol 6.25 mg bid, hydralazine 100 mg q8h, Imdur 30 mg daily, olmesartan 40 mg daily, clonidine 0.1 mg tab bid, tolerating well without side effects - He is not currently symptomatic. - Pt denies headache, lightheadedness, dizziness, changes in vision, chest tightness/pressure, palpitations, leg swelling, sudden loss of speech or loss of consciousness. 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 - His diet is moderate in salt, moderate in fat, and moderate in carbohydrates. Uses lemon pepper some.   Sleep Apnea  Patient was contacted by his insurance RNCM.  Patient is not using his CPAP machine currently and was encouraged by [facility] to do so.  Patient is not sure  where his CPAP parts are.  Notes he would not usually use it because "there are other things that keep me up" like urination, others.   Social History   Tobacco Use  . Smoking status: Current Every Day Smoker    Packs/day: 0.50    Types: Cigarettes  . Smokeless tobacco: Current User  Substance Use Topics  . Alcohol use: Yes    Alcohol/week: 2.0 standard drinks    Types: 2 Cans of beer per week    Comment: 2 x 32 oz cans weekend, pint vodka every 3 weeks  . Drug use: No    Review of Systems Per HPI unless specifically indicated above     Objective:    BP (!) 143/93 (BP Location: Right Arm, Patient Position: Sitting, Cuff Size: Large)   Pulse 69   Ht 6' (1.829 m)   Wt (!) 307 lb (139.3 kg)   BMI 41.64 kg/m   Wt Readings from Last 3 Encounters:  08/22/19 (!) 307 lb (139.3 kg)  02/25/19 298 lb 9.6 oz (135.4 kg)  01/21/19 (!) 300 lb 9.6 oz (136.4 kg)    Physical Exam Vitals signs reviewed.  Constitutional:      General: He is awake. He is not in acute distress.    Appearance: Normal appearance. He is well-developed. He is obese.  HENT:     Head: Normocephalic and atraumatic.  Neck:     Musculoskeletal: Normal range of motion and neck supple.     Vascular: No  carotid bruit.  Cardiovascular:     Rate and Rhythm: Normal rate and regular rhythm.     Pulses:          Radial pulses are 2+ on the right side and 2+ on the left side.       Posterior tibial pulses are 1+ on the right side and 1+ on the left side.     Heart sounds: Normal heart sounds, S1 normal and S2 normal.  Pulmonary:     Effort: Pulmonary effort is normal. No respiratory distress.     Breath sounds: Normal breath sounds and air entry.  Musculoskeletal:     Right knee: Normal.     Right ankle: He exhibits decreased range of motion (RIGHT ankle weakness (4/5 in all directions), mild pain and limited PROM with inversion, eversion). He exhibits no swelling, no ecchymosis, no deformity, no laceration and normal  pulse. No tenderness. Achilles tendon normal.     Right lower leg: Normal.  Skin:    General: Skin is warm and dry.     Capillary Refill: Capillary refill takes less than 2 seconds.  Neurological:     General: No focal deficit present.     Mental Status: He is alert and oriented to person, place, and time. Mental status is at baseline.  Psychiatric:        Attention and Perception: Attention normal.        Mood and Affect: Mood and affect normal.        Behavior: Behavior normal. Behavior is cooperative.        Thought Content: Thought content normal.        Judgment: Judgment normal.    Results for orders placed or performed in visit on 01/21/19  COMPLETE METABOLIC PANEL WITH GFR  Result Value Ref Range   Glucose, Bld 81 65 - 99 mg/dL   BUN 16 7 - 25 mg/dL   Creat 1.611.58 (H) 0.960.60 - 1.35 mg/dL   GFR, Est Non African American 51 (L) > OR = 60 mL/min/1.6373m2   GFR, Est African American 59 (L) > OR = 60 mL/min/1.4573m2   BUN/Creatinine Ratio 10 6 - 22 (calc)   Sodium 139 135 - 146 mmol/L   Potassium 4.4 3.5 - 5.3 mmol/L   Chloride 107 98 - 110 mmol/L   CO2 23 20 - 32 mmol/L   Calcium 9.1 8.6 - 10.3 mg/dL   Total Protein 6.4 6.1 - 8.1 g/dL   Albumin 3.7 3.6 - 5.1 g/dL   Globulin 2.7 1.9 - 3.7 g/dL (calc)   AG Ratio 1.4 1.0 - 2.5 (calc)   Total Bilirubin 0.4 0.2 - 1.2 mg/dL   Alkaline phosphatase (APISO) 58 36 - 130 U/L   AST 17 10 - 40 U/L   ALT 16 9 - 46 U/L  Hemoglobin A1c  Result Value Ref Range   Hgb A1c MFr Bld 5.8 (H) <5.7 % of total Hgb   Mean Plasma Glucose 120 (calc)   eAG (mmol/L) 6.6 (calc)      Assessment & Plan:   Problem List Items Addressed This Visit      Cardiovascular and Mediastinum   Accelerated hypertension Remains uncontrolled and labile. BP goal < 130/80 for kidney protection. Patient would benefit from return to cardiology, nephrology.  Patient's lifestyle also with improvement for improving diet and increasing activity.  -complications: CKD stage  IV, OSA  Plan: 1.  Continue amlodipine 10 mg daily, carvedilol 6.25 mg twice daily with meals, hydralazine  100 mg every 8 hours, Imdur 30 mg daily, olmesartan 40 mg daily, clonidine 0.1 mg twice daily. 2.  Labs today with CMP to reassess electrolytes and kidney function. 3.  Encouraged patient to have heart healthy diet, regular activity up to 30 minutes most days of the week. 4.  Recommended cardiology referral.  Patient refuses at this time. 5.  Follow-up 3 months.   Relevant Medications   carvedilol (COREG) 6.25 MG tablet   amLODipine (NORVASC) 10 MG tablet   Other Relevant Orders   COMPLETE METABOLIC PANEL WITH GFR     Respiratory   OSA (obstructive sleep apnea) Currently uncontrolled.  Patient is not wearing CPAP machine.  Plan: 1.  Encouraged patient to obtain his CPAP machine out of storage. 2.  We will refill CPAP supplies PRN 3.  Educated patient about connection between hypertension and sleep apnea. 4.  Follow-up 3 months     Endocrine   Other specified hypothyroidism Stable today on exam.  Medications tolerated without side effects.  Continue at current doses.  Refills provided.  Check labs today. Followup 6 months.    Other Relevant Orders   TSH   T4, free     Other   HLD (hyperlipidemia) - Primary Uncontrolled hyperlipidemia.  Currently taking atorvastatin 40 mg daily. No new ASCVD events since last visit.   - No personal history of prior ASCVD events - Family history of ASCVD events/atherosclerosis.  Plan: 1. Continue atorvastatin 40 mg once daily. - Reviewed common side effects of myalgia (reversible off med), less common side effects of cognitive impairment (reversible off med), increased glucose, rhabdomyolysis. 2. Discussed low glycemic diet. - handout reinforced 3. Discussed increasing exercise to 30 minutes most days of the week. 4. Follow-up with labs in about 3 months.    Relevant Medications   carvedilol (COREG) 6.25 MG tablet   amLODipine  (NORVASC) 10 MG tablet   Other Relevant Orders   Lipid panel      Meds ordered this encounter  Medications  . amLODipine (NORVASC) 10 MG tablet    Sig: Take 1 tablet (10 mg total) by mouth daily.    Dispense:  90 tablet    Refill:  0    Order Specific Question:   Supervising Provider    Answer:   Smitty Cords [[id number]]   Follow up plan: Return in about 3 months (around 11/21/2019) for hypertension.  Wilhelmina Mcardle, DNP, AGPCNP-BC Adult Gerontology Primary Care Nurse Practitioner [facility] 08/22/2019, 2:35 PM

## 2019-08-22 NOTE — Patient Instructions (Addendum)
Oscar Jones,   Thank you for coming in to clinic today.  1. No changes to blood pressure medications today. - GOAL is less than 140/90 for now.  Less than 130/80 is better  2. START using your CPAP machine every day. - this may help [place] lower your blood pressure medication. - Call us if need orders for supplies.  3. For your right foot You have a RIGHT ankle muscle strain.  - Start taking Tylenol extra strength 1 to 2 tablets every 6-8 hours for aches or fever/chills for next few days as needed.  Do not take more than 3,000 mg in 24 hours from all medicines.   - NO ibuprofen or naproxen sodium - Use heat and ice.  Apply this for 15 minutes at a time 6-8 times per day.   - Muscle rub with lidocaine, lidocaine patch, Biofreeze, or tiger balm for topical pain relief.  Avoid using this with heat and ice to avoid burns. - START physical therapy if not improving.  Order was provided today for [facility].  4. You will be due for FASTING BLOOD WORK.  This means you should eat no food or drink after midnight.  Drink only water or coffee without cream/sugar on the morning of your lab visit. - Please go ahead and schedule a "Lab Only" visit in the morning at the clinic for lab draw in the next 7 days. - Your results will be available about 2-3 days after blood draw.  If you have set up a MyChart account, you can can log in to MyChart online to view your results and a brief explanation. Also, we can discuss your results together at your next office visit if you would like.  Please schedule a follow-up appointment with Cassell Smiles, AGNP. Return in about 3 months (around 11/21/2019) for hypertension.  If you have any other questions or concerns, please feel free to call the clinic or send a message through [place]. You may also schedule an earlier appointment if necessary.  You will receive a survey after today's visit either digitally by e-mail or paper by [employer]. Your  experiences and feedback matter to [place].  Please respond so we know how we are doing as we provide care for you.   Cassell Smiles, DNP, AGNP-BC Adult Gerontology Nurse Practitioner [place]

## 2019-08-23 ENCOUNTER — Encounter: Payer: Self-pay | Admitting: Nurse Practitioner

## 2019-08-24 ENCOUNTER — Other Ambulatory Visit: Payer: Self-pay | Admitting: Nurse Practitioner

## 2019-08-24 ENCOUNTER — Encounter: Payer: Self-pay | Admitting: Nurse Practitioner

## 2019-08-24 DIAGNOSIS — E890 Postprocedural hypothyroidism: Secondary | ICD-10-CM

## 2019-08-24 LAB — COMPLETE METABOLIC PANEL WITH GFR
AG Ratio: 1.1 (calc) (ref 1.0–2.5)
ALT: 22 U/L (ref 9–46)
AST: 23 U/L (ref 10–35)
Albumin: 3.8 g/dL (ref 3.6–5.1)
Alkaline phosphatase (APISO): 68 U/L (ref 35–144)
BUN/Creatinine Ratio: 10 (calc) (ref 6–22)
BUN: 15 mg/dL (ref 7–25)
CO2: 23 mmol/L (ref 20–32)
Calcium: 9.3 mg/dL (ref 8.6–10.3)
Chloride: 105 mmol/L (ref 98–110)
Creat: 1.47 mg/dL — ABNORMAL HIGH (ref 0.70–1.33)
GFR, Est African American: 64 mL/min/{1.73_m2} (ref >=60)
GFR, Est Non African American: 55 mL/min/{1.73_m2} — ABNORMAL LOW (ref >=60)
Globulin: 3.4 g/dL (calc) (ref 1.9–3.7)
Glucose, Bld: 91 mg/dL (ref 65–99)
Potassium: 4 mmol/L (ref 3.5–5.3)
Sodium: 137 mmol/L (ref 135–146)
Total Bilirubin: 0.4 mg/dL (ref 0.2–1.2)
Total Protein: 7.2 g/dL (ref 6.1–8.1)

## 2019-08-24 LAB — T4, FREE: Free T4: 1.1 ng/dL (ref 0.8–1.8)

## 2019-08-24 LAB — LIPID PANEL
Cholesterol: 167 mg/dL (ref <200)
HDL: 59 mg/dL (ref >=40)
LDL Cholesterol (Calc): 93 mg/dL (calc)
Non-HDL Cholesterol (Calc): 108 mg/dL (calc) (ref <130)
Total CHOL/HDL Ratio: 2.8 (calc) (ref <5.0)
Triglycerides: 63 mg/dL (ref <150)

## 2019-08-24 LAB — TSH: TSH: 9.35 mIU/L — ABNORMAL HIGH (ref 0.40–4.50)

## 2019-08-24 MED ORDER — LEVOTHYROXINE SODIUM 175 MCG PO TABS: 175.0000 ug | ORAL_TABLET | Freq: Every day | ORAL | 0 refills | Status: DC

## 2019-08-25 ENCOUNTER — Other Ambulatory Visit: Payer: Self-pay | Admitting: Nurse Practitioner

## 2019-08-25 DIAGNOSIS — E782 Mixed hyperlipidemia: Secondary | ICD-10-CM

## 2019-08-25 NOTE — Telephone Encounter (Signed)
From [name], CMA: I called the patient and went over his lab results with him. He verbalize understanding, but proceeded to ask if you would order a Right hip Xray for him. He said he have discuss his hip pain with you prior. 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    Oscar Jones may have mentioned his hip pain in the past (I do remember this concern), but we have not formally evaluated this. It has always been discussed as a limitation to physical activity only.  I cannot justify radiation exposure for him with an xray without formal evaluation of his pain and his hip.  Please schedule follow-up appointment for hip evaluation if he continues to have hip pain that he would like [place] to evaluate with an xray.

## 2019-11-16 ENCOUNTER — Other Ambulatory Visit: Payer: Self-pay

## 2019-11-16 ENCOUNTER — Ambulatory Visit (INDEPENDENT_AMBULATORY_CARE_PROVIDER_SITE_OTHER): Payer: 59 | Admitting: Family Medicine

## 2019-11-16 ENCOUNTER — Encounter: Payer: Self-pay | Admitting: Family Medicine

## 2019-11-16 DIAGNOSIS — I10 Essential (primary) hypertension: Secondary | ICD-10-CM | POA: Diagnosis not present

## 2019-11-16 DIAGNOSIS — R0600 Dyspnea, unspecified: Secondary | ICD-10-CM

## 2019-11-16 DIAGNOSIS — G4733 Obstructive sleep apnea (adult) (pediatric): Secondary | ICD-10-CM

## 2019-11-16 DIAGNOSIS — R351 Nocturia: Secondary | ICD-10-CM

## 2019-11-16 DIAGNOSIS — R0609 Other forms of dyspnea: Secondary | ICD-10-CM

## 2019-11-16 NOTE — Progress Notes (Signed)
Virtual Visit via Telephone The purpose of this virtual visit is to provide medical care while limiting exposure to the novel coronavirus (COVID19) for both patient and office staff.  Consent was obtained for phone visit:  Yes.   Answered questions that patient had about telehealth interaction:  Yes.   I discussed the limitations, risks, security and privacy concerns of performing an evaluation and management service by telephone. I also discussed with the patient that there may be a patient responsible charge related to this service. The patient expressed understanding and agreed to proceed.  Patient Location: Home Provider Location: [facility])  ---------------------------------------------------------------------- Chief Complaint  Patient presents with  . Hypertension    pt blood pressure lastnight was 130/80. The pt concern that blood pressure medication is causing him to be fatigue. He recently mad some diet changes, decreasing the salt in his diet and now his energy had decreased.    Previous PCP Wilhelmina Mcardle, AGPCNP-BC   S: Reviewed CMA documentation. I have called patient and gathered additional HPI as follows:  CHRONIC HTN: Reports he is taking blood pressure medication, he says he has episodes of feeling out of strength or fatigued. He has reduced or stopped all sodium in diet, he has done heart healthy diet to eliminate salt. 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  Some days he has to stop on 2nd tier of steps or walking short distances has to stop, will have calf aching, fatigue, shortness of breath. Denies any chest pain.  Last BP at 130/80  He had prior ECHO in 2019. He does not have Cardiologist. He had prior Stress Test done in 2017.  - Current medications: amlodipine 10 mg daily, carvedilol 6.25 mg bid, hydralazine 100 mg q8h, Imdur 30 mg daily, olmesartan 40 mg daily, clonidine 0.1  mg tab bid  He is concerned that BP medication may be causing some of his symptoms, but he would like to rule out heart condition as well. His previous PCP recommended that he see a Cardiologist for similar symptoms in past.  Denies CP, dyspnea, HA, edema, dizziness / lightheadedness  Additional concerns  OSA, with nocturia He has very poor sleep and wakes up with nocturia often. No prior history or diagnosis of BPH. He has not tried medicine for this.   Denies any high risk travel to areas of current concern for COVID19. Denies any known or suspected exposure to person with or possibly with COVID19.  Denies any fevers, chills, sweats, body ache, cough, sinus pain or pressure, headache, abdominal pain, diarrhea  Past Medical History:  Diagnosis Date  . Anxiety   . Discoid lupus   . HLD (hyperlipidemia)   . Hypertension   . OSA (obstructive sleep apnea)    Social History   Tobacco Use  . Smoking status: Current Every Day Smoker    Packs/day: 0.50    Types: Cigarettes  . Smokeless tobacco: Never Used  Substance Use Topics  . Alcohol use: Not Currently    Alcohol/week: 2.0 standard drinks    Types: 2 Cans of beer per week    Comment: 2 x 32 oz cans weekend, pint vodka every 3 weeks  . Drug use: No    Current Outpatient Medications:  .  amLODipine (NORVASC) 10 MG tablet, Take 1 tablet (10 mg total) by mouth daily., Disp: 90 tablet, Rfl: 0 .  atorvastatin (LIPITOR) 40 MG tablet, TAKE 1  TABLET(40 MG) BY MOUTH DAILY AT 6 PM, Disp: 90 tablet, Rfl: 1 .  carvedilol (COREG) 6.25 MG tablet, Take 6.25 mg by mouth 2 (two) times daily with a meal. , Disp: , Rfl:  .  clobetasol ointment (TEMOVATE) 2.83 %, Apply 1 application topically 2 (two) times daily., Disp: 30 g, Rfl: 0 .  cloNIDine (CATAPRES) 0.1 MG tablet, Take 1 tablet (0.1 mg total) by mouth 2 (two) times daily., Disp: 60 tablet, Rfl: 5 .  halobetasol (ULTRAVATE) 0.05 % ointment, Apply twice a day to affected areas, Disp: ,  Rfl:  .  isosorbide mononitrate (IMDUR) 30 MG 24 hr tablet, TAKE 1 TABLET(30 MG) BY MOUTH DAILY, Disp: 90 tablet, Rfl: 1 .  levothyroxine (SYNTHROID) 175 MCG tablet, Take 1 tablet (175 mcg total) by mouth daily before breakfast., Disp: 90 tablet, Rfl: 0 .  olmesartan (BENICAR) 40 MG tablet, Take 1 tablet (40 mg total) by mouth daily., Disp: 90 tablet, Rfl: 1  Depression screen [facility] 2/9 12/02/2018 03/12/2018  Decreased Interest 0 0  Down, Depressed, Hopeless 0 0  PHQ - 2 Score 0 0    No flowsheet data found.  -------------------------------------------------------------------------- O: No physical exam performed due to remote telephone encounter.  Lab results reviewed.  Study Result  Result status: Final result                   *[place], [place] [address]                            [phone]  ------------------------------------------------------------------- Transthoracic Echocardiography  Patient:    Marx, Doig MR #:       [id number] Study Date: 12/09/2017 Gender:     M Age:        48 Height:     182.9 cm Weight:     147.7 kg BSA:        2.81 m^2 Pt. Status: Room:       250A   ADMITTING    Dayna Ramus  ATTENDING    Demetrios Loll Md  SONOGRAPHER  Sonia Side Hege RDCS  PERFORMING   [facility]  cc:  ------------------------------------------------------------------- LV EF: 55% -   60%  ------------------------------------------------------------------- Indications:      Chest pain 786.50.  ------------------------------------------------------------------- History:   PMH:  Hyperlipidemia, anxiety, OSA.  Risk factors: Hypertension.  ------------------------------------------------------------------- Study Conclusions  - Procedure narrative: Transthoracic echocardiography. The study   was  technically difficult. - Left ventricle: There was moderate concentric hypertrophy.   Systolic function was normal. The estimated ejection fraction was   in the range of 55% to 60%. Features are consistent with a   pseudonormal left ventricular filling pattern, with concomitant   abnormal relaxation and increased filling pressure (grade 2   diastolic dysfunction). - Mitral valve: There was mild regurgitation. - Left atrium: The atrium was normal in size. - Right ventricle: Systolic function was normal. - Pulmonary arteries: Systolic pressure could not be accurately   estimated.  Impressions:  - Sinus bradycardia noted.  ------------------------------------------------------------------- Study data:  Study status:  Routine.  Procedure:  The patient reported no pain pre or post test. Transthoracic echocardiography. The study was technically difficult.          Transthoracic echocardiography.  M-mode, complete 2D, spectral Doppler, and color Doppler.  Birthdate:  Patient birthdate: 09-22-1971.  Age:  Patient is 48 yr old.  Sex:  Gender: male.    BMI: 44.1 kg/m^2.  Blood pressure:     142/113  Patient status:  Inpatient.  Study date: Study date: 12/09/2017. Study time: 08:14 AM.  Location:  Echo laboratory.  -------------------------------------------------------------------  ------------------------------------------------------------------- Left ventricle:  There was moderate concentric hypertrophy. Systolic function was normal. The estimated ejection fraction was in the range of 55% to 60%. Features are consistent with a pseudonormal left ventricular filling pattern, with concomitant abnormal relaxation and increased filling pressure (grade 2 diastolic dysfunction).  ------------------------------------------------------------------- Aortic valve:  Poorly visualized.  Structurally normal valve. Cusp separation was normal.  Doppler:  Transvalvular velocity was within the  normal range. There was no stenosis. There was no regurgitation.    Peak velocity ratio of LVOT to aortic valve: 0.79. Valve area (Vmax): 2.72 cm^2. Indexed valve area (Vmax): 0.97 cm^2/m^2.    Peak gradient (S): 6 mm Hg.  ------------------------------------------------------------------- Aorta:  The aorta was normal, not dilated, and non-diseased.  ------------------------------------------------------------------- Mitral valve:  Poorly visualized.  Structurally normal valve. Leaflet separation was normal.  Doppler:  Transvalvular velocity was within the normal range. There was no evidence for stenosis. There was mild regurgitation.    Valve area by pressure half-time: 2.65 cm^2. Indexed valve area by pressure half-time: 0.94 cm^2/m^2.    Peak gradient (D): 2 mm Hg.  ------------------------------------------------------------------- Left atrium:  The atrium was normal in size.  ------------------------------------------------------------------- Right ventricle:  The cavity size was normal. Wall thickness was normal. Systolic function was normal.  ------------------------------------------------------------------- Pulmonic valve:   Poorly visualized.  Structurally normal valve. Cusp separation was normal.  Doppler:  Transvalvular velocity was within the normal range. There was no regurgitation.  ------------------------------------------------------------------- Tricuspid valve:   Structurally normal valve.   Leaflet separation was normal.  Doppler:  Transvalvular velocity was within the normal range. There was trivial regurgitation.  ------------------------------------------------------------------- Pulmonary artery:   Poorly visualized.  Systolic pressure could not be accurately estimated.  ------------------------------------------------------------------- Right atrium:  The atrium was normal in  size.  ------------------------------------------------------------------- Pericardium:  The pericardium was normal in appearance.   ------------------------------------------------------------------- Post procedure conclusions Ascending Aorta:  - The aorta was normal, not dilated, and non-diseased.  ------------------------------------------------------------------- Measurements   Left ventricle                           Value          Reference  LV ID, ED, PLAX chordal                  47.3  mm       43 - 52  LV ID, ES, PLAX chordal                  34.5  mm       23 - 38  LV fx shortening, PLAX chordal   (L)     27    %        >=29  LV PW thickness, ED                      17  mm       ----------  IVS/LV PW ratio, ED                      1.05           <=1.3  LV e&', lateral                           4.79  cm/s     ----------  LV E/e&', lateral                         15.59          ----------  LV e&', medial                            3.26  cm/s     ----------  LV E/e&', medial                          22.91          ----------  LV e&', average                           4.03  cm/s     ----------  LV E/e&', average                         18.56          ----------    Ventricular septum                       Value          Reference  IVS thickness, ED                        17.8  mm       ----------    LVOT                                     Value          Reference  LVOT ID, S                               21    mm       ----------  LVOT area                                3.46  cm^2     ----------  LVOT peak velocity, S                    99.9  cm/s     ----------    Aortic valve                             Value          Reference  Aortic valve peak velocity, S            127   cm/s     ----------  Aortic peak gradient, S  6     mm Hg    ----------  Velocity ratio, peak, LVOT/AV            0.79           ----------  Aortic valve area, peak velocity          2.72  cm^2     ----------  Aortic valve area/bsa, peak              0.97  cm^2/m^2 ----------  velocity    Aorta                                    Value          Reference  Aortic root ID, ED                       36    mm       ----------    Left atrium                              Value          Reference  LA ID, A-P, ES                           40    mm       ----------  LA ID/bsa, A-P                           1.42  cm/m^2   <=2.2  LA volume, S                             106   ml       ----------  LA volume/bsa, S                         37.7  ml/m^2   ----------  LA volume, ES, 1-p A4C                   91.6  ml       ----------  LA volume/bsa, ES, 1-p A4C               32.6  ml/m^2   ----------  LA volume, ES, 1-p A2C                   112   ml       ----------  LA volume/bsa, ES, 1-p A2C               39.9  ml/m^2   ----------    Mitral valve                             Value          Reference  Mitral E-wave peak velocity              74.7  cm/s     ----------  Mitral A-wave peak velocity              71.8  cm/s     ----------  Mitral deceleration time         (  H)     282   ms       150 - 230  Mitral pressure half-time                83    ms       ----------  Mitral peak gradient, D                  2     mm Hg    ----------  Mitral E/A ratio, peak                   1              ----------  Mitral valve area, PHT, DP               2.65  cm^2     ----------  Mitral valve area/bsa, PHT, DP           0.94  cm^2/m^2 ----------    Right atrium                             Value          Reference  RA ID, S-I, ES, A4C              (H)     67.3  mm       34 - 49  RA area, ES, A4C                 (H)     26.2  cm^2     8.3 - 19.5  RA volume, ES, A/L                       81.7  ml       ----------  RA volume/bsa, ES, A/L                   29.1  ml/m^2   ----------    Right ventricle                          Value          Reference  RV ID, ED, PLAX                           35.1  mm       19 - 38  RV s&', lateral, S                        13.2  cm/s     ----------  Legend: (L)  and  (H)  mark values outside specified reference range.  ------------------------------------------------------------------- Prepared and Electronically Authenticated by  Dossie Arbour, MD, [facility] 2019-01-02T12:01:38      Recent Results (from the past 2160 hour(s))  TSH     Status: Abnormal   Collection Time: 08/23/19  8:12 AM  Result Value Ref Range   TSH 9.35 (H) 0.40 - 4.50 mIU/L  T4, free     Status: None   Collection Time: 08/23/19  8:12 AM  Result Value Ref Range   Free T4 1.1 0.8 - 1.8 ng/dL  Lipid panel     Status: None   Collection Time: 08/23/19  8:12 AM  Result Value Ref Range   Cholesterol 167 <200 mg/dL  HDL 59 > OR = 40 mg/dL   Triglycerides 63 <161 mg/dL   LDL Cholesterol (Calc) 93 mg/dL (calc)    Comment: Reference range: <100 . Desirable range <100 mg/dL for primary prevention;   <70 mg/dL for patients with CHD or diabetic patients  with > or = 2 CHD risk factors. Marland Kitchen LDL-C is now calculated using the Martin-Hopkins  calculation, which is a validated novel method providing  better accuracy than the Friedewald equation in the  estimation of LDL-C.  Horald Pollen et al. Lenox Ahr. 0960;454(09): 2061-2068  (http://education.[facility].com/faq/FAQ164)    Total CHOL/HDL Ratio 2.8 <5.0 (calc)   Non-HDL Cholesterol (Calc) 108 <130 mg/dL (calc)    Comment: For patients with diabetes plus 1 major ASCVD risk  factor, treating to a non-HDL-C goal of <100 mg/dL  (LDL-C of <81 mg/dL) is considered a therapeutic  option.   COMPLETE METABOLIC PANEL WITH GFR     Status: Abnormal   Collection Time: 08/23/19  8:12 AM  Result Value Ref Range   Glucose, Bld 91 65 - 99 mg/dL    Comment: .            Fasting reference interval .    BUN 15 7 - 25 mg/dL   Creat 1.91 (H) 4.78 - 1.33 mg/dL    Comment: For patients >77 years of age, the reference limit for  Creatinine is approximately 13% higher for people identified as African-American. .    GFR, Est Non African American 55 (L) > OR = 60 mL/min/1.18m2   GFR, Est African American 64 > OR = 60 mL/min/1.12m2   BUN/Creatinine Ratio 10 6 - 22 (calc)   Sodium 137 135 - 146 mmol/L   Potassium 4.0 3.5 - 5.3 mmol/L   Chloride 105 98 - 110 mmol/L   CO2 23 20 - 32 mmol/L   Calcium 9.3 8.6 - 10.3 mg/dL   Total Protein 7.2 6.1 - 8.1 g/dL   Albumin 3.8 3.6 - 5.1 g/dL   Globulin 3.4 1.9 - 3.7 g/dL (calc)   AG Ratio 1.1 1.0 - 2.5 (calc)   Total Bilirubin 0.4 0.2 - 1.2 mg/dL   Alkaline phosphatase (APISO) 68 35 - 144 U/L   AST 23 10 - 35 U/L   ALT 22 9 - 46 U/L    -------------------------------------------------------------------------- A&P:  Problem List Items Addressed This Visit    OSA (obstructive sleep apnea)   Essential hypertension - Primary   Relevant Orders   Ambulatory referral to Cardiology   Dyspnea on exertion   Relevant Orders   Ambulatory referral to Cardiology    Other Visit Diagnoses    Nocturia         #HTN / Dyspnea on Exertion / Fatigue Clinically on polypharmacy with multiple HTN medications, now he has improved lifestyle, eliminated or dramatically reduced sodium intake, and he has issues with exertional fatigue and dyspnea - [place] in [place] without available records, cardiac stress test among other with ECHO as documented above more recently in 2019  Concern with extensive BP medication regimen may be contributing, but ultimately in a virtual visit with this patient, I have not previously been managing, I would agree with his previous PCP and proceed with referral to Cardiologist for more definitive updated testing and med management as well.  Referral to [facility]  In meantime, he may reduce Carvedilol from 6.25mg  BID to 3.125mg  BID he can cut the tab in half to take  half BID, may reduce some fatigue symptoms,  ultimately if his BP is actually improved with his diet change and low sodium diet now, he may not need extensive BP medication management, but will defer this to further evaluation once his cardiac work up is updated.   #OSA / Nocturia Not the focus of visit today but additionally addressed  Additional cause of his fatigue likely poor sleep OSA - will need to be addressed with sleep study, would warrant an upcoming visit to focus on this issue and place appropriate orders.  In interval for nocturia, he most likely has some component of BPH, offered him preliminary therapy with Saw palmetto 80-160 BID OTC herbal option for now, until he can follow up and focus on OSA / BPH  Orders Placed This Encounter  Procedures  . Ambulatory referral to Cardiology    Referral Priority:   Routine    Referral Type:   Consultation    Referral Reason:   Specialty Services Required    Requested Specialty:   Cardiology    Number of Visits Requested:   1     No orders of the defined types were placed in this encounter.   Follow-up: 3 month HTN, possible BPH, cardiology follow-up w/ new provider  Patient verbalizes understanding with the above medical recommendations including the limitation of remote medical advice.  Specific follow-up and call-back criteria were given for patient to follow-up or seek medical care more urgently if needed.   - Time spent in direct consultation with patient on phone: 12 minutes   Saralyn Pilar, DO [facility] 11/16/2019, 3:15 PM

## 2019-11-16 NOTE — Patient Instructions (Addendum)
Cut Carvedilol coreg in half take half pill twice a day see if improve side effects  Start OTC saw palmetto 80 or 160mg  twice a day for prostate increased urination symptoms  Referral ordered  [place] [facility]) [facility] at [address], [place] [address] Main: [phone]   Please schedule a Follow-up Appointment to: Return in about 3 months (around 02/14/2020) for 3 month HTN, possible BPH, cardiology follow-up with new provider.  If you have any other questions or concerns, please feel free to call the office or send a message through [place]. You may also schedule an earlier appointment if necessary.  Additionally, you may be receiving a survey about your experience at our office within a few days to 1 week by e-mail or mail. We value your feedback.  Nobie Putnam, DO [place]

## 2019-11-17 IMAGING — CR DG CHEST 2V
2 series · 2 of 2 positions shown · non-contrast
Comparison: None.

CLINICAL DATA: Intermittent chest pain for 2 weeks.

EXAM:
CHEST  2 VIEW

[chest pa]
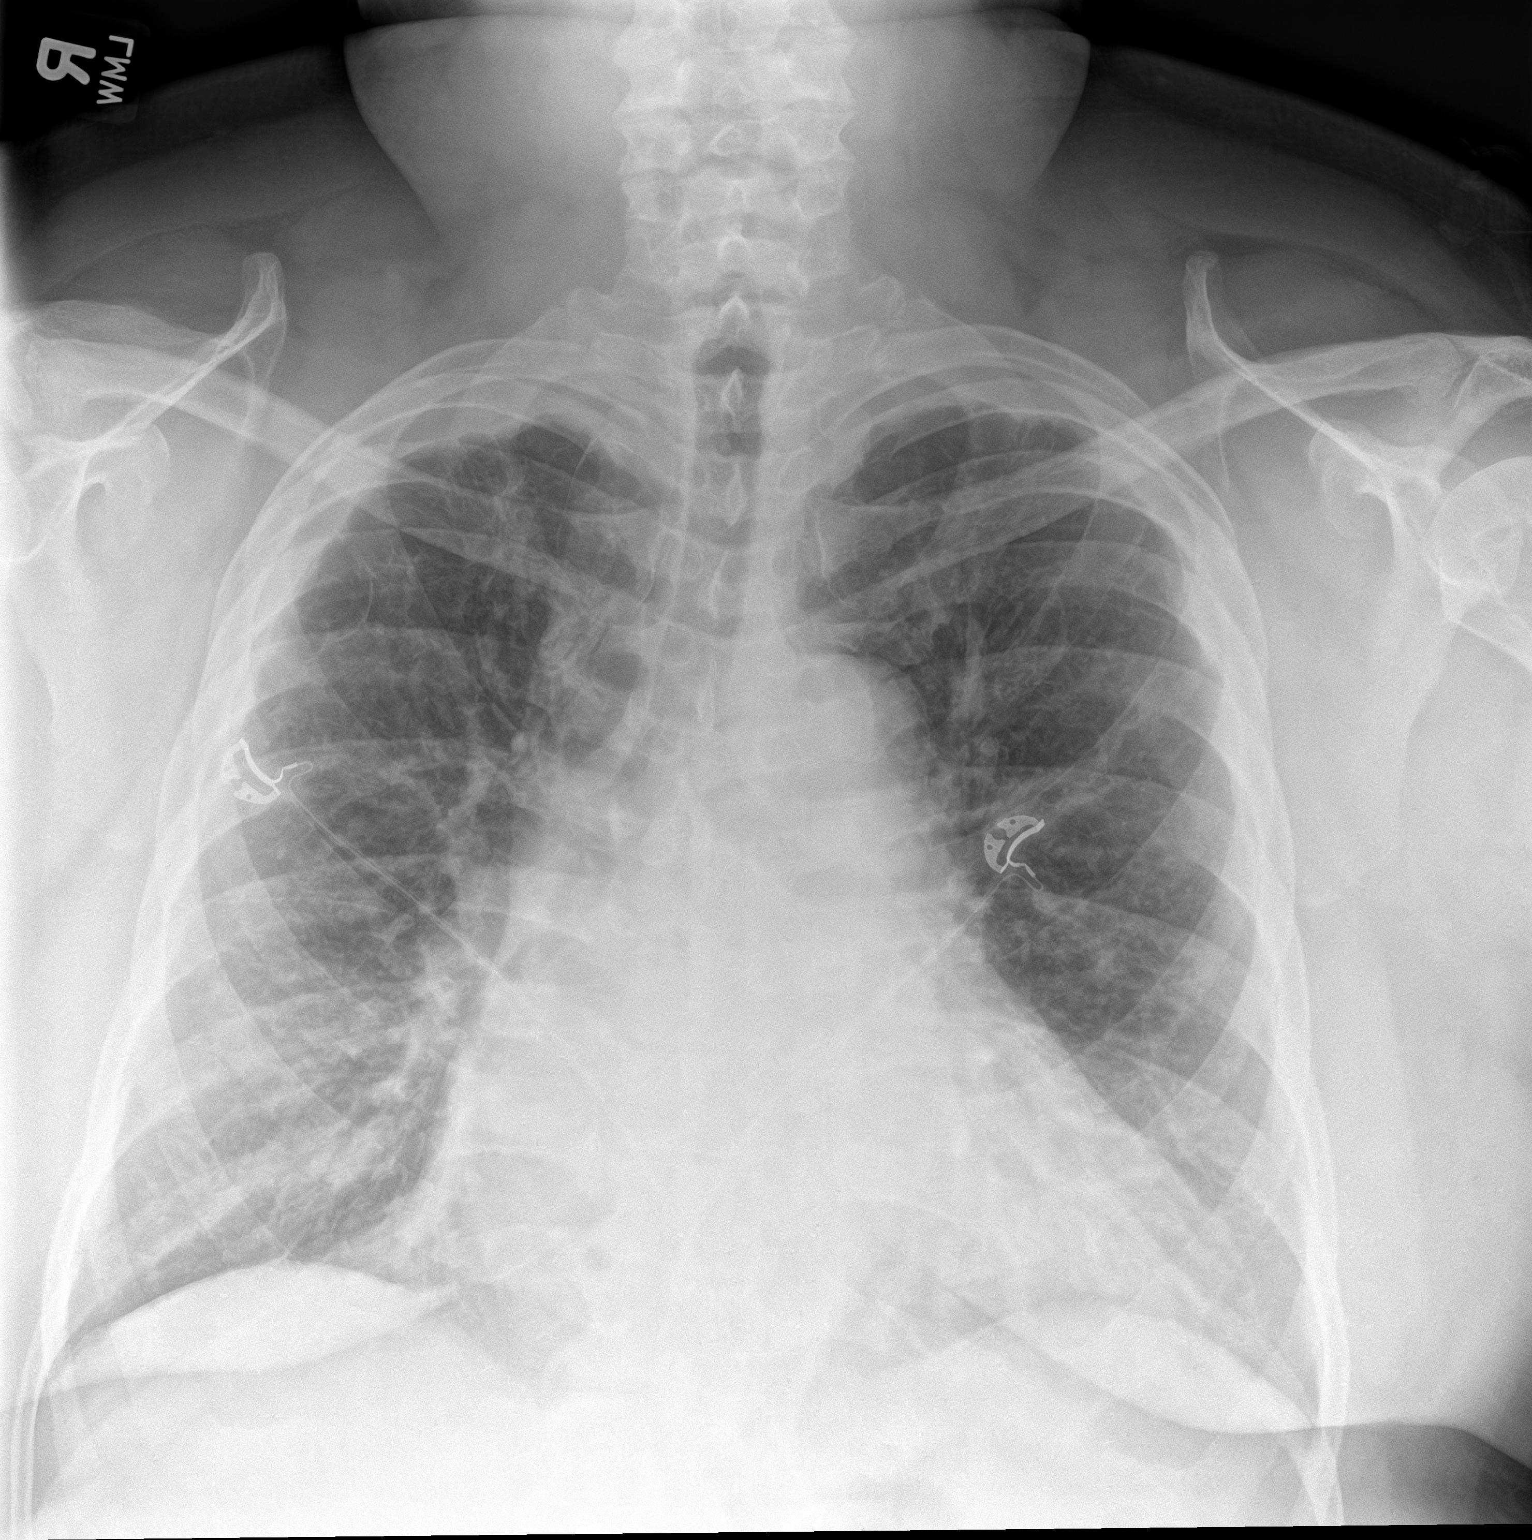

[chest lat]
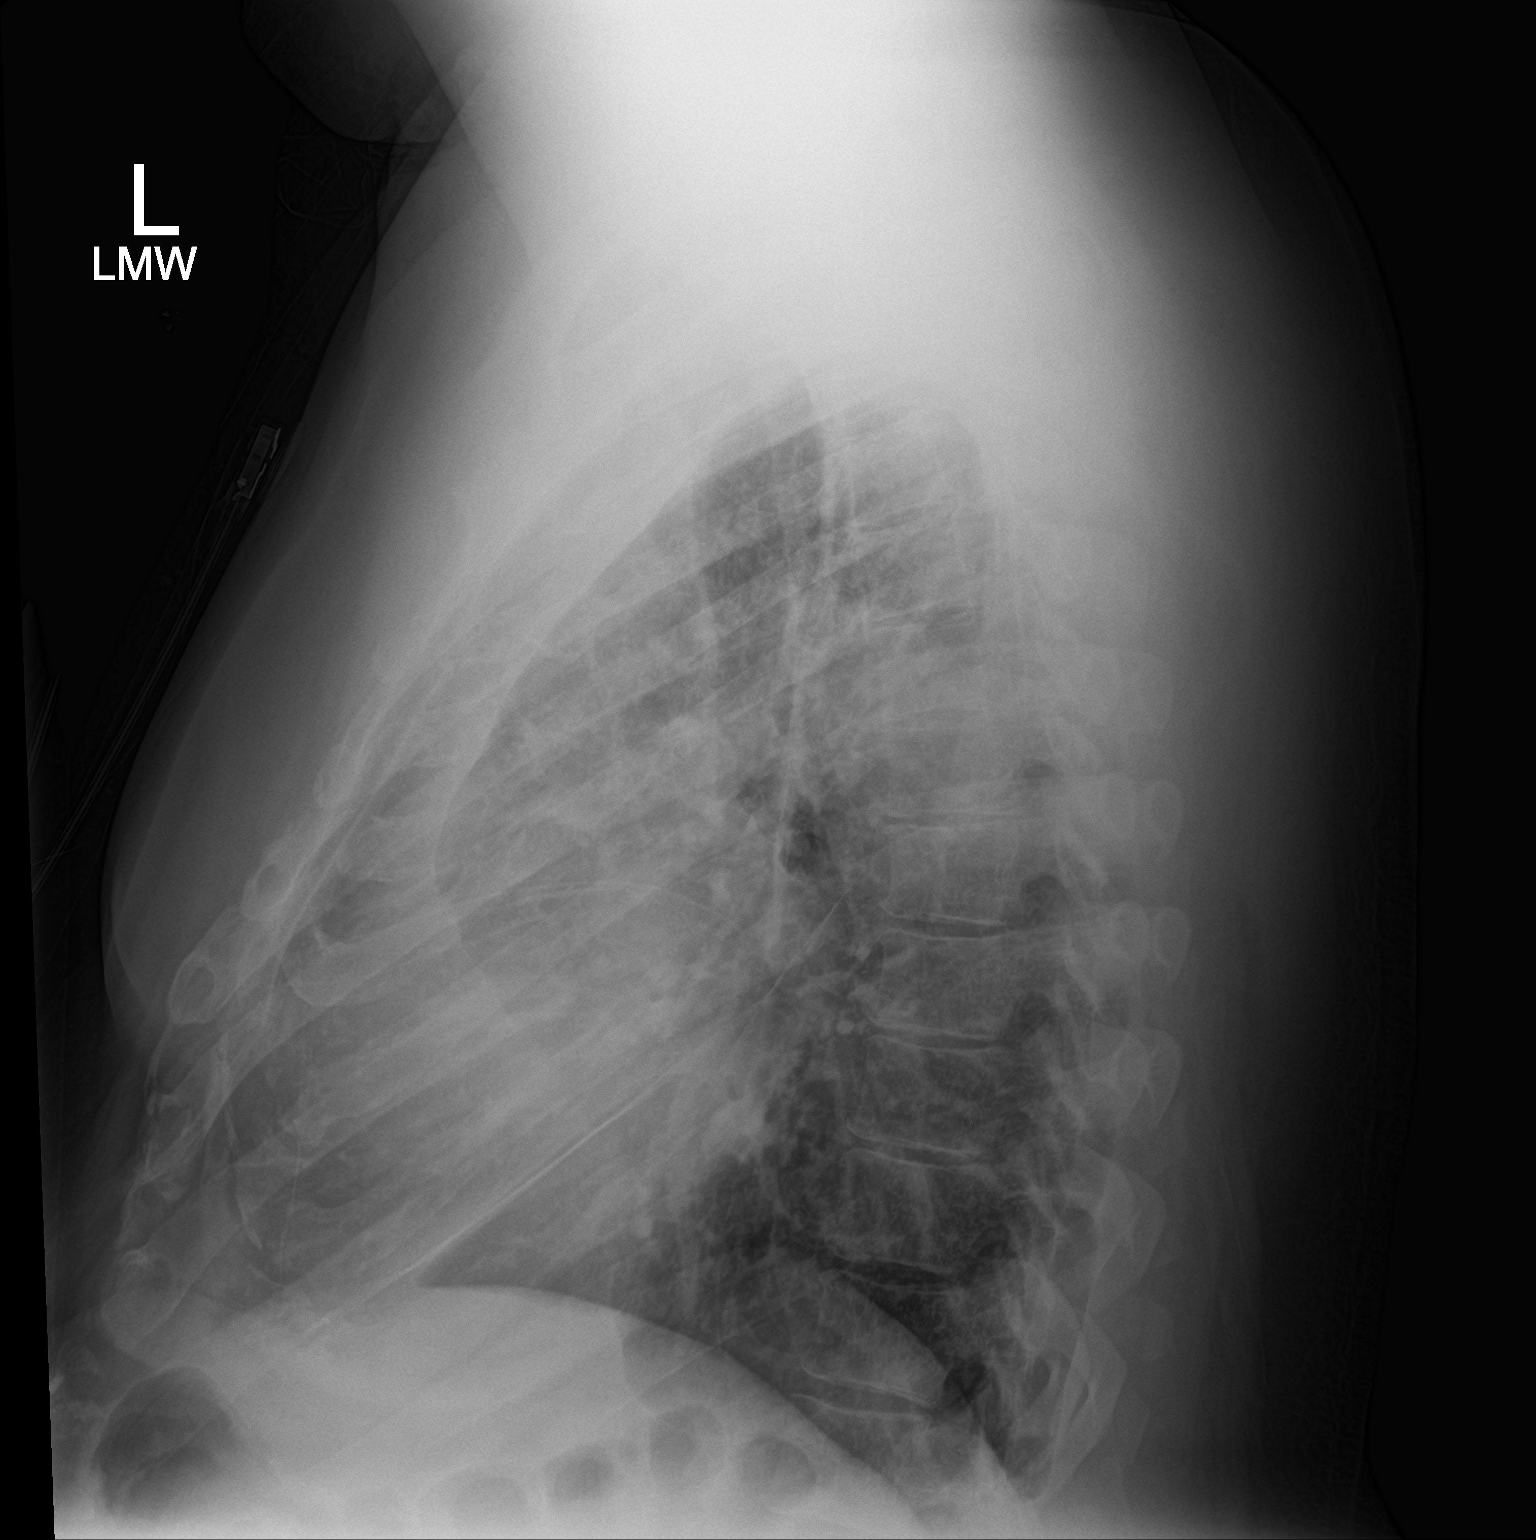

[2 of 2 positions shown; findings below may reference images not displayed]

FINDINGS: The mediastinal contour is normal and heart size is enlarged. Aorta
is tortuous. Biapical scarring are noted. Mild patchy opacity of
right lung base is identified. There is no pulmonary edema or
pleural effusion. The visualized skeletal structures are
unremarkable.
IMPRESSION: Mild patchy opacity of right lung base which could reflect
atelectasis or early pneumonia.

## 2019-11-24 ENCOUNTER — Other Ambulatory Visit: Payer: Self-pay | Admitting: Nurse Practitioner

## 2019-11-24 ENCOUNTER — Other Ambulatory Visit: Payer: Self-pay | Admitting: Family Medicine

## 2019-11-24 DIAGNOSIS — E890 Postprocedural hypothyroidism: Secondary | ICD-10-CM

## 2019-11-24 DIAGNOSIS — I10 Essential (primary) hypertension: Secondary | ICD-10-CM

## 2019-11-25 ENCOUNTER — Other Ambulatory Visit: Payer: Self-pay | Admitting: Nurse Practitioner

## 2019-11-25 DIAGNOSIS — I10 Essential (primary) hypertension: Secondary | ICD-10-CM

## 2019-12-06 ENCOUNTER — Ambulatory Visit: Payer: 59 | Admitting: Cardiology

## 2019-12-22 ENCOUNTER — Other Ambulatory Visit: Payer: Self-pay | Admitting: Nurse Practitioner

## 2019-12-22 DIAGNOSIS — I10 Essential (primary) hypertension: Secondary | ICD-10-CM

## 2019-12-27 ENCOUNTER — Other Ambulatory Visit: Payer: Self-pay | Admitting: Nurse Practitioner

## 2019-12-27 DIAGNOSIS — I10 Essential (primary) hypertension: Secondary | ICD-10-CM

## 2019-12-27 MED ORDER — AMLODIPINE BESYLATE 10 MG PO TABS: ORAL_TABLET | ORAL | 0 refills | Status: DC

## 2019-12-27 NOTE — Telephone Encounter (Signed)
Pt. Is  requesting refill onamlodipine 10 mg

## 2019-12-27 NOTE — Telephone Encounter (Signed)
Pt called requesting refill on amlodipine

## 2020-02-03 ENCOUNTER — Encounter: Payer: Self-pay | Admitting: Family Medicine

## 2020-02-03 ENCOUNTER — Other Ambulatory Visit: Payer: Self-pay

## 2020-02-03 ENCOUNTER — Ambulatory Visit: Payer: 59 | Admitting: Family Medicine

## 2020-02-03 VITALS — BP 119/83 | HR 73 | Temp 97.5°F | Ht 72.0 in | Wt 311.6 lb

## 2020-02-03 DIAGNOSIS — G4733 Obstructive sleep apnea (adult) (pediatric): Secondary | ICD-10-CM

## 2020-02-03 DIAGNOSIS — L732 Hidradenitis suppurativa: Secondary | ICD-10-CM | POA: Insufficient documentation

## 2020-02-03 DIAGNOSIS — L930 Discoid lupus erythematosus: Secondary | ICD-10-CM | POA: Diagnosis not present

## 2020-02-03 DIAGNOSIS — N183 Chronic kidney disease, stage 3 unspecified: Secondary | ICD-10-CM

## 2020-02-03 DIAGNOSIS — Z125 Encounter for screening for malignant neoplasm of prostate: Secondary | ICD-10-CM

## 2020-02-03 DIAGNOSIS — G8929 Other chronic pain: Secondary | ICD-10-CM

## 2020-02-03 DIAGNOSIS — E559 Vitamin D deficiency, unspecified: Secondary | ICD-10-CM

## 2020-02-03 DIAGNOSIS — Z6841 Body Mass Index (BMI) 40.0 and over, adult: Secondary | ICD-10-CM

## 2020-02-03 DIAGNOSIS — E038 Other specified hypothyroidism: Secondary | ICD-10-CM | POA: Diagnosis not present

## 2020-02-03 DIAGNOSIS — N529 Male erectile dysfunction, unspecified: Secondary | ICD-10-CM

## 2020-02-03 DIAGNOSIS — M79671 Pain in right foot: Secondary | ICD-10-CM

## 2020-02-03 DIAGNOSIS — I129 Hypertensive chronic kidney disease with stage 1 through stage 4 chronic kidney disease, or unspecified chronic kidney disease: Secondary | ICD-10-CM

## 2020-02-03 DIAGNOSIS — I10 Essential (primary) hypertension: Secondary | ICD-10-CM

## 2020-02-03 DIAGNOSIS — L989 Disorder of the skin and subcutaneous tissue, unspecified: Secondary | ICD-10-CM | POA: Insufficient documentation

## 2020-02-03 DIAGNOSIS — E782 Mixed hyperlipidemia: Secondary | ICD-10-CM

## 2020-02-03 DIAGNOSIS — E039 Hypothyroidism, unspecified: Secondary | ICD-10-CM

## 2020-02-03 MED ORDER — HALOBETASOL PROPIONATE 0.05 % EX OINT: TOPICAL_OINTMENT | CUTANEOUS | 1 refills | Status: DC

## 2020-02-03 NOTE — Progress Notes (Signed)
Subjective:    Patient ID: Oscar Jones, male    DOB: 26-Nov-1971, 49 y.o.   MRN: [id number]  Oscar Jones is a 49 y.o. male presenting on 02/03/2020 for Hypothyroidism (nocturia, pt complains that he get up a lot at bedtime to go to the bathroom. ), Hypertension, Hyperlipidemia, Rash, Foot Pain, and Erectile Dysfunction   HPI  Oscar Jones presents to clinic for follow up for his hypertension, hypothyroidism, OSA, hyperlipidemia, obesity, discoid lupus, vitamin d deficiency, skin concerns and erectile dysfunction.    States he has been taking his medications but is unsure that he is tolerating his hypertension medications well, sometimes feels dizzy first thing in the morning after he takes his medications.  Has been getting up in the middle of the night to urinate multiple times and is worried if this is related to his hydralazine that he takes before bed.    Has some numbness and tingling in his right foot, pain in the right foot along the outer side through the heel.  He has had this evaluated by Orthopedics and a Podiatrist with special orthotics made.  Has used and says makes symptoms worse.  Has been having difficulty climbing stairs with his foot pain.  Has OSA with a CPAP machine but has not been using it due to not being able to clean it and feels like it is "choking" him at night.  Is continue to snore when he is able to sleep in between nighttime bathroom breaks every 1.5-2 hours.  Had followed with Rheumatology and was diagnosed with Discoid Lupus, was put onto Plaquenil for years and reports stopping because he felt that the medication was not beneficial for him.  Has not returned to Rheumatology.  Had been following with a Nephrologist for CKD but has no additional follow up.  Reports that he was told that once his blood pressure had become stable he did not have to return.  Had been following with a Dermatologist for skin condition, that was told was possibly related to his  Lupus, has resulted in multiple firm cyst like lesions underneath the skin of his upper back, and has left him with hair loss, itching, scarring of his scalp, back of his neck and along his face.  Has had some of these areas in his armpits and groin sporadically but none currently.  States Dermatology had put him on Methotrexate for a short course and it had helped his symptoms for skin and his joint pain but that provider had left the clinic and it hasn't been continued or followed up on yet.  States he had some relief with "Halobetasol Propionate 0.05% - 50gram tube" using topically on his skin lesions but has run out of this prescription.  Discussed option of chronic care management with help of coordinating multiple referrals and medication review.   Depression screen [facility] 2/9 02/03/2020 12/02/2018 03/12/2018  Decreased Interest 0 0 0  Down, Depressed, Hopeless 0 0 0  PHQ - 2 Score 0 0 0    Social History   Tobacco Use  . Smoking status: Current Every Day Smoker    Packs/day: 0.50    Types: Cigarettes  . Smokeless tobacco: Never Used  Substance Use Topics  . Alcohol use: Yes    Alcohol/week: 2.0 standard drinks    Types: 2 Cans of beer per week    Comment: 2 x 32 oz cans weekend, pint vodka every 3 weeks  . Drug use: No    Review of Systems  Constitutional: Negative.   HENT: Negative.   Eyes: Negative.   Respiratory: Negative.   Cardiovascular: Negative.   Gastrointestinal: Negative.   Endocrine: Negative for polydipsia, polyphagia and polyuria.  Genitourinary: Positive for frequency. Negative for decreased urine volume, difficulty urinating, discharge, dysuria, enuresis, flank pain, genital sores, hematuria, penile pain, penile swelling, scrotal swelling, testicular pain and urgency.  Musculoskeletal: Positive for myalgias. Negative for back pain, gait problem and joint swelling.  Skin: Positive for rash. Negative for pallor.  Allergic/Immunologic: Negative.   Neurological:  Positive for numbness. Negative for dizziness, tremors, seizures, syncope, facial asymmetry, speech difficulty, weakness, light-headedness and headaches.  Hematological: Negative.   Psychiatric/Behavioral: Negative.    Per HPI unless specifically indicated above     Objective:    BP 119/83 (BP Location: Right Arm, Patient Position: Sitting, Cuff Size: Large)   Pulse 73   Temp (!) 97.5 F (36.4 C) (Oral)   Ht 6' (1.829 m)   Wt (!) 311 lb 9.6 oz (141.3 kg)   BMI 42.26 kg/m   Wt Readings from Last 3 Encounters:  02/03/20 (!) 311 lb 9.6 oz (141.3 kg)  08/22/19 (!) 307 lb (139.3 kg)  02/25/19 298 lb 9.6 oz (135.4 kg)    Physical Exam Vitals reviewed.  Constitutional:      General: He is not in acute distress.    Appearance: Normal appearance. He is well-groomed. He is obese. He is not ill-appearing or toxic-appearing.  HENT:     Head: Normocephalic.  Eyes:     General: Lids are normal. Vision grossly intact.        Right eye: No discharge.        Left eye: No discharge.     Extraocular Movements: Extraocular movements intact.     Conjunctiva/sclera: Conjunctivae normal.     Pupils: Pupils are equal, round, and reactive to light.  Pulmonary:     Effort: Pulmonary effort is normal. No respiratory distress.  Musculoskeletal:        General: Normal range of motion.  Skin:    General: Skin is warm and dry.     Capillary Refill: Capillary refill takes less than 2 seconds.     Findings: Lesion (Hidradenitis Supportiva mid-upper back, around jaw line, scalp and base of neck) and rash present.  Neurological:     General: No focal deficit present.     Mental Status: He is alert and oriented to person, place, and time.     Cranial Nerves: No cranial nerve deficit.     Gait: Gait normal.  Psychiatric:        Attention and Perception: Attention and perception normal.        Mood and Affect: Mood and affect normal.        Speech: Speech normal.        Behavior: Behavior normal.  Behavior is cooperative.        Thought Content: Thought content normal.        Cognition and Memory: Cognition and memory normal.        Judgment: Judgment normal.     Results for orders placed or performed in visit on 08/22/19  TSH  Result Value Ref Range   TSH 9.35 (H) 0.40 - 4.50 mIU/L  T4, free  Result Value Ref Range   Free T4 1.1 0.8 - 1.8 ng/dL  Lipid panel  Result Value Ref Range   Cholesterol 167 <200 mg/dL   HDL 59 > OR = 40 mg/dL   Triglycerides  63 <150 mg/dL   LDL Cholesterol (Calc) 93 mg/dL (calc)   Total CHOL/HDL Ratio 2.8 <5.0 (calc)   Non-HDL Cholesterol (Calc) 108 <130 mg/dL (calc)  COMPLETE METABOLIC PANEL WITH GFR  Result Value Ref Range   Glucose, Bld 91 65 - 99 mg/dL   BUN 15 7 - 25 mg/dL   Creat 3.41 (H) 9.37 - 1.33 mg/dL   GFR, Est Non African American 55 (L) > OR = 60 mL/min/1.69m2   GFR, Est African American 64 > OR = 60 mL/min/1.54m2   BUN/Creatinine Ratio 10 6 - 22 (calc)   Sodium 137 135 - 146 mmol/L   Potassium 4.0 3.5 - 5.3 mmol/L   Chloride 105 98 - 110 mmol/L   CO2 23 20 - 32 mmol/L   Calcium 9.3 8.6 - 10.3 mg/dL   Total Protein 7.2 6.1 - 8.1 g/dL   Albumin 3.8 3.6 - 5.1 g/dL   Globulin 3.4 1.9 - 3.7 g/dL (calc)   AG Ratio 1.1 1.0 - 2.5 (calc)   Total Bilirubin 0.4 0.2 - 1.2 mg/dL   Alkaline phosphatase (APISO) 68 35 - 144 U/L   AST 23 10 - 35 U/L   ALT 22 9 - 46 U/L      Assessment & Plan:   Problem List Items Addressed This Visit      Cardiovascular and Mediastinum   Essential hypertension - Primary    Well controlled blood pressure readings on current medication regimen.  Currently taking amlodipine 10mg  daily, carvedilol 6.25mg  2x a day, hydralazine 100mg  every 8 hours, and olmesartan 40mg  daily  and tolerating it fairly well.  Has been having episodes of feeling dizzy/lightheaded in the mornings intermittently but has not taken his blood pressure when this happens.   Plan: 1) Labs ordered today and will have completed next  week, will call with results when received. 2) Continue taking medications as prescribed at this time 3) Take your blood pressure 2x a day and write in a log.  Bring that log to your next appointment. 4) Heart healthy diet and to exercise every other day for 30 minutes per day, going no more than 2 days in a row without exercise. 5) I have put in a referral to chronic care management for assistance with medication management       Relevant Medications   hydrALAZINE (APRESOLINE) 100 MG tablet   Other Relevant Orders   Ambulatory referral to Chronic Care Management Services   CBC with Differential   COMPLETE METABOLIC PANEL WITH GFR     Respiratory   OSA (obstructive sleep apnea)    Patient is not wearing his CPAP machine as directed.  States he is unsure how to clean it, feels that he cannot tolerate it at night, and is unsure if it needs to be recalibrated.  Educated patient that weight loss can help his OSA, that his OSA being uncontrolled can make his hypertension worse.    Will put in referral for sleep medicine for evaluation and discussion of other treatments other than CPAP.       Relevant Orders   Ambulatory referral to Chronic Care Management Services     Endocrine   Other specified hypothyroidism    Unknown status of thyroid.  Last labs drawn 10/2018.  Plan: 1) Have labs drawn within the next 1-2 weeks and will call with the results 2) Continue taking the levothyroxine daily at this time 3) We will see you back in 6 months for re-evaluation  Relevant Orders   Ambulatory referral to Chronic Care Management Services     Musculoskeletal and Integument   Discoid lupus    Does not currently have a Rheumatologist on file for care of his Lupus.  Is interested in meeting with a new Rheumatologist for further evaluation, diagnosis, prognosis and treatment options for his diagnosis of Lupus.  Plan - Referral for Rheumatology placed       Relevant Orders    Ambulatory referral to Chronic Care Management Services   CBC with Differential   Ambulatory referral to Rheumatology   Hidradenitis suppurativa    Has seen [facility] on 09/12/2019 and was diagnosed with Hidradenitis Suppurativa and was prescribed topical creams that he had some improvement on.  Had improvement with Methotrexate but it was not continued due to his CKD.  Requesting refill on his Halobetasol Propionate topical ointment.  Will send in refill today and coordinate care to establish him with a dermatologist.      Relevant Medications   halobetasol (ULTRAVATE) 0.05 % ointment   Other Relevant Orders   Ambulatory referral to Chronic Care Management Services     Genitourinary   Hypertensive CKD (chronic kidney disease)   Relevant Orders   COMPLETE METABOLIC PANEL WITH GFR     Other   HLD (hyperlipidemia)    Unknown lipid status.  Last labs completed 10/2018.  Currently taking Atorvastatin 40mg  tablet daily and feels that he is tolerating it fairly well.  Complicated by obesity.  Plan: 1) Have labs drawn within the next 1-2 weeks and will call with the results 2) Continue with Atorvastatin 40mg  daily 3) Heart healthy diet and to exercise every other day for 30 minutes per day, going no more than 2 days in a row without exercise. 4) We will see you back in 6 months      Relevant Medications   hydrALAZINE (APRESOLINE) 100 MG tablet   Other Relevant Orders   Ambulatory referral to Chronic Care Management Services   CBC with Differential   COMPLETE METABOLIC PANEL WITH GFR   Lipid Profile   Chronic foot pain, right    Has had chronic right foot pain that has been worked up with podiatry and orthopedics being diagnosed with Pes Planus and given custom orthotics with worsening of symptoms.  Pain sounds neuropathic based on history and physical.   Plan: 1) Keep referral to Rheumatology for their specialist opinion. 2) If continued symptoms after meeting with  Rheumatology will refer to Neurology for further work up.      Relevant Orders   Ambulatory referral to Chronic Care Management Services   Obesity   Relevant Orders   Ambulatory referral to Chronic Care Management Services   Erectile dysfunction    Newly reported concern happening over the past few months.  Discussed OSA, HTN, and obesity can factor into erectile dysfunction.  We can plan to draw labs for testosterone, educated those should be drawn between 8am-10am to achieve accurate results, and will call with results.  He should continue to work on lifestyle modifications, decreasing his caloric intake, begin a heart healthy diet and try to exercise daily or every other day for a minimum of 30 minutes per day, going no more than 2 days in a row without exercise.      Relevant Orders   Testosterone,Free and Total    Other Visit Diagnoses    Hypothyroidism, unspecified type       Relevant Orders   Thyroid Panel With TSH  Vitamin D deficiency       Relevant Orders   Vitamin D (25 hydroxy)   Screening for malignant neoplasm of prostate       Relevant Orders   PSA   Morbid obesity (HCC)       Relevant Orders   HgB A1c      Meds ordered this encounter  Medications  . halobetasol (ULTRAVATE) 0.05 % ointment    Sig: Apply twice a day to affected areas    Dispense:  50 g    Refill:  1    Order Specific Question:   Supervising Provider    Answer:   Smitty Cords [[id number]]      Follow up plan: Return in about 6 months (around 08/02/2020) for HTN. Will continue to work with CCM on this patient to assist with his chronic conditions and will see sooner dependent on lab results.  Charlaine Dalton, FNP Family Nurse Practitioner [facility] 02/03/2020, 3:40 PM

## 2020-02-03 NOTE — Assessment & Plan Note (Signed)
Well controlled blood pressure readings on current medication regimen.  Currently taking amlodipine 10mg  daily, carvedilol 6.25mg  2x a day, hydralazine 100mg  every 8 hours, and olmesartan 40mg  daily  and tolerating it fairly well.  Has been having episodes of feeling dizzy/lightheaded in the mornings intermittently but has not taken his blood pressure when this happens.   Plan: 1) Labs ordered today and will have completed next week, will call with results when received. 2) Continue taking medications as prescribed at this time 3) Take your blood pressure 2x a day and write in a log.  Bring that log to your next appointment. 4) Heart healthy diet and to exercise every other day for 30 minutes per day, going no more than 2 days in a row without exercise. 5) I have put in a referral to chronic care management for assistance with medication management

## 2020-02-03 NOTE — Assessment & Plan Note (Addendum)
Unknown lipid status.  Last labs completed 10/2018.  Currently taking Atorvastatin 40mg  tablet daily and feels that he is tolerating it fairly well.  Complicated by obesity.  Plan: 1) Have labs drawn within the next 1-2 weeks and will call with the results 2) Continue with Atorvastatin 40mg  daily 3) Heart healthy diet and to exercise every other day for 30 minutes per day, going no more than 2 days in a row without exercise. 4) We will see you back in 6 months

## 2020-02-03 NOTE — Patient Instructions (Addendum)
As we discussed will be reaching out to care management for additional needs/resources and a medication review to see if we are able to adjust your medications based on your concerns.  Referral placed for Rheumatology for further evaluation and treatment of your Discoid Lupus.  I have put in lab work orders for you to complete within the next 1-2 weeks for evaluation of your chronic conditions.  I have sent in a prescription Halobetasol Propionate for your Hidradinitis Supportiva.  Use this as directed.  Contact the sleep center where you had received your CPAP for titration and cleaning supplies for the machine.  Being using.  You will receive a survey after today's visit either digitally by e-mail or paper by [employer]. Your experiences and feedback matter to [place].  Please respond so we know how we are doing as we provide care for you.  Call us with any questions/concerns/needs.  It is my goal to be available to you for your health concerns.  Thanks for choosing me to be a partner in your healthcare needs!  Charlaine Dalton, FNP-C Family Nurse Practitioner [facility] Phone: [phone]

## 2020-02-03 NOTE — Assessment & Plan Note (Addendum)
Has seen [facility] on 09/12/2019 and was diagnosed with Hidradenitis Suppurativa and was prescribed topical creams that he had some improvement on.  Had improvement with Methotrexate but it was not continued due to his CKD.  Requesting refill on his Halobetasol Propionate topical ointment.  Will send in refill today and coordinate care to establish him with a dermatologist.

## 2020-02-03 NOTE — Assessment & Plan Note (Signed)
Unknown status of thyroid.  Last labs drawn 10/2018.  Plan: 1) Have labs drawn within the next 1-2 weeks and will call with the results 2) Continue taking the levothyroxine daily at this time 3) We will see you back in 6 months for re-evaluation

## 2020-02-03 NOTE — Assessment & Plan Note (Signed)
Has had chronic right foot pain that has been worked up with podiatry and orthopedics being diagnosed with Pes Planus and given custom orthotics with worsening of symptoms.  Pain sounds neuropathic based on history and physical.   Plan: 1) Keep referral to Rheumatology for their specialist opinion. 2) If continued symptoms after meeting with Rheumatology will refer to Neurology for further work up.

## 2020-02-03 NOTE — Assessment & Plan Note (Signed)
Newly reported concern happening over the past few months.  Discussed OSA, HTN, and obesity can factor into erectile dysfunction.  We can plan to draw labs for testosterone, educated those should be drawn between 8am-10am to achieve accurate results, and will call with results.  He should continue to work on lifestyle modifications, decreasing his caloric intake, begin a heart healthy diet and try to exercise daily or every other day for a minimum of 30 minutes per day, going no more than 2 days in a row without exercise.

## 2020-02-03 NOTE — Assessment & Plan Note (Signed)
Does not currently have a Rheumatologist on file for care of his Lupus.  Is interested in meeting with a new Rheumatologist for further evaluation, diagnosis, prognosis and treatment options for his diagnosis of Lupus.  Plan - Referral for Rheumatology placed

## 2020-02-03 NOTE — Assessment & Plan Note (Signed)
Patient is not wearing his CPAP machine as directed.  States he is unsure how to clean it, feels that he cannot tolerate it at night, and is unsure if it needs to be recalibrated.  Educated patient that weight loss can help his OSA, that his OSA being uncontrolled can make his hypertension worse.    Will put in referral for sleep medicine for evaluation and discussion of other treatments other than CPAP.

## 2020-02-05 NOTE — Progress Notes (Signed)
I have reviewed this encounter including the documentation in this note and/or discussed this patient with the provider, Danielle Rankin FNP. I am certifying that I agree with the content of this note as supervising physician.  Saralyn Pilar, DO [facility] 02/05/2020, 11:15 AM

## 2020-02-06 ENCOUNTER — Other Ambulatory Visit: Payer: Self-pay

## 2020-02-06 ENCOUNTER — Other Ambulatory Visit: Payer: 59

## 2020-02-07 ENCOUNTER — Encounter: Payer: Self-pay | Admitting: Family Medicine

## 2020-02-07 ENCOUNTER — Other Ambulatory Visit: Payer: Self-pay | Admitting: Family Medicine

## 2020-02-07 ENCOUNTER — Telehealth: Payer: Self-pay

## 2020-02-07 DIAGNOSIS — E559 Vitamin D deficiency, unspecified: Secondary | ICD-10-CM | POA: Insufficient documentation

## 2020-02-07 DIAGNOSIS — N529 Male erectile dysfunction, unspecified: Secondary | ICD-10-CM

## 2020-02-07 DIAGNOSIS — R7303 Prediabetes: Secondary | ICD-10-CM | POA: Insufficient documentation

## 2020-02-07 LAB — LIPID PANEL
Cholesterol: 174 mg/dL (ref <200)
HDL: 58 mg/dL (ref >=40)
LDL Cholesterol (Calc): 99 mg/dL (calc)
Non-HDL Cholesterol (Calc): 116 mg/dL (calc) (ref <130)
Total CHOL/HDL Ratio: 3 (calc) (ref <5.0)
Triglycerides: 83 mg/dL (ref <150)

## 2020-02-07 LAB — CBC WITH DIFFERENTIAL/PLATELET
Absolute Monocytes: 663 cells/uL (ref 200–950)
Basophils Absolute: 41 cells/uL (ref 0–200)
Basophils Relative: 0.8 %
Eosinophils Absolute: 143 cells/uL (ref 15–500)
Eosinophils Relative: 2.8 %
HCT: 45.3 % (ref 38.5–50.0)
Hemoglobin: 15.3 g/dL (ref 13.2–17.1)
Lymphs Abs: 2015 cells/uL (ref 850–3900)
MCH: 31.3 pg (ref 27.0–33.0)
MCHC: 33.8 g/dL (ref 32.0–36.0)
MCV: 92.6 fL (ref 80.0–100.0)
MPV: 11.3 fL (ref 7.5–12.5)
Monocytes Relative: 13 %
Neutro Abs: 2239 cells/uL (ref 1500–7800)
Neutrophils Relative %: 43.9 %
Platelets: 172 10*3/uL (ref 140–400)
RBC: 4.89 10*6/uL (ref 4.20–5.80)
RDW: 15.2 % — ABNORMAL HIGH (ref 11.0–15.0)
Total Lymphocyte: 39.5 %
WBC: 5.1 10*3/uL (ref 3.8–10.8)

## 2020-02-07 LAB — COMPLETE METABOLIC PANEL WITH GFR
AG Ratio: 1.2 (calc) (ref 1.0–2.5)
ALT: 19 U/L (ref 9–46)
AST: 17 U/L (ref 10–35)
Albumin: 3.9 g/dL (ref 3.6–5.1)
Alkaline phosphatase (APISO): 63 U/L (ref 35–144)
BUN: 20 mg/dL (ref 7–25)
CO2: 23 mmol/L (ref 20–32)
Calcium: 9.4 mg/dL (ref 8.6–10.3)
Chloride: 108 mmol/L (ref 98–110)
Creat: 1.29 mg/dL (ref 0.70–1.33)
GFR, Est African American: 74 mL/min/{1.73_m2} (ref >=60)
GFR, Est Non African American: 64 mL/min/{1.73_m2} (ref >=60)
Globulin: 3.2 g/dL (calc) (ref 1.9–3.7)
Glucose, Bld: 96 mg/dL (ref 65–99)
Potassium: 4.3 mmol/L (ref 3.5–5.3)
Sodium: 139 mmol/L (ref 135–146)
Total Bilirubin: 0.4 mg/dL (ref 0.2–1.2)
Total Protein: 7.1 g/dL (ref 6.1–8.1)

## 2020-02-07 LAB — THYROID PANEL WITH TSH
Free Thyroxine Index: 3.7 (ref 1.4–3.8)
T3 Uptake: 30 % (ref 22–35)
T4, Total: 12.4 ug/dL — ABNORMAL HIGH (ref 4.9–10.5)
TSH: 0.48 mIU/L (ref 0.40–4.50)

## 2020-02-07 LAB — HEMOGLOBIN A1C
Hgb A1c MFr Bld: 6.1 % of total Hgb — ABNORMAL HIGH (ref <5.7)
Mean Plasma Glucose: 128 (calc)
eAG (mmol/L): 7.1 (calc)

## 2020-02-07 LAB — PSA: PSA: 0.5 ng/mL (ref <=4.0)

## 2020-02-07 LAB — VITAMIN D 25 HYDROXY (VIT D DEFICIENCY, FRACTURES): Vit D, 25-Hydroxy: 13 ng/mL — ABNORMAL LOW (ref 30–100)

## 2020-02-07 NOTE — Progress Notes (Signed)
Labs show prediabetes with an A1C of 6.1%, diet and exercise can help with weight reduction to avoid this from increasing and becoming a full diabetes diagnosis.  His vitamin D is deficient and he can take over the counter supplementation or I can send in a weekly supplement of vitamin D to take over the next 12 weeks and we can repeat his levels in 3 months.  The rest of his labs are very similar to previous readings and look great.    Can you find out with the lab where his Testosterone labs are?  Thanks

## 2020-02-10 NOTE — Telephone Encounter (Signed)
Opening in error

## 2020-02-13 ENCOUNTER — Telehealth: Payer: Self-pay | Admitting: General Practice

## 2020-02-13 ENCOUNTER — Ambulatory Visit: Payer: Self-pay | Admitting: General Practice

## 2020-02-13 DIAGNOSIS — N183 Chronic kidney disease, stage 3 unspecified: Secondary | ICD-10-CM

## 2020-02-13 DIAGNOSIS — I129 Hypertensive chronic kidney disease with stage 1 through stage 4 chronic kidney disease, or unspecified chronic kidney disease: Secondary | ICD-10-CM

## 2020-02-13 DIAGNOSIS — E782 Mixed hyperlipidemia: Secondary | ICD-10-CM

## 2020-02-13 DIAGNOSIS — I10 Essential (primary) hypertension: Secondary | ICD-10-CM

## 2020-02-13 NOTE — Patient Instructions (Addendum)
Visit Information  Goals Addressed            This Visit's Progress   . RNCM: PT-"I am making lifestyle changes to help me"       CARE PLAN ENTRY (see longtitudinal plan of care for additional care plan information)  Current Barriers:  . Chronic Disease Management support, education, and care coordination needs related to HTN, HLD, and CKD   Clinical Goal(s) related to HTN, HLD, and CKD:  Over the next 120 days, patient will:  . Work with the care management team to address educational, disease management, and care coordination needs  . Begin or continue self health monitoring activities as directed today Measure and record blood pressure 5 times per week and adhere to a Heart healthy/Carb modified  diet . Call provider office for new or worsened signs and symptoms Blood pressure findings outside established parameters, Chest pain, Shortness of breath, and New or worsened symptom related to Chronic health conditions . Call care management team with questions or concerns . Verbalize basic understanding of patient centered plan of care established today  Interventions related to HTN, HLD, and CKD :  . Evaluation of current treatment plans and patient's adherence to plan as established by provider . Assessed patient understanding of disease states . Assessed patient's education and care coordination needs . Provided disease specific education to patient- education on Heart Healthy/Carb modified diet- will supply education through my chart system . Collaborated with appropriate clinical care team members regarding patient needs- Referral to Pharmacist for assistance with med questions and some cost concerns.  Referral to LCSW for assistance with help as needed for education and support related to social habits . Secure chat with scheduler to get lab appointment changed from tomorrow to Thursday due to patient not having transportation in the am. . Assessed the patients social habits of  using alcohol and smoking. The patient is working on smoking cessation. Smokes 1/2 pack per day for last 6 years. The patient states his consumption of alcohol is his outlet and he has to have something to get away from things at times. He does not drink during the week but does on the weekends. . Encouraged the patient to monitor his blood pressure as directed by the pcp. The patient verbalized he does not have a lot of time. Encouraged the patient to make changes that will help him improve his health and well being. Will continue to work with the patient to help with his needs.   Patient Self Care Activities related to HTN, HLD, and CKD :  . Patient is unable to independently self-manage chronic health conditions  Initial goal documentation        Oscar Jones was given information about Care Management services today including:  1. Care Management services include personalized support from designated clinical staff supervised by his physician, including individualized plan of care and coordination with other care providers 2. 24/7 contact phone numbers for assistance for urgent and routine care needs. 3. The patient may stop CCM services at any time (effective at the end of the month) by phone call to the office staff.  Patient agreed to services and verbal consent obtained.   Patient verbalizes understanding of instructions provided today.   The care management team will reach out to the patient again over the next 60 days.   Noreene Larsson RN, MSN, [facility] Mobile: [phone]  [facility] Eating Plan  DASH stands for "Dietary Approaches to Stop Hypertension." The DASH eating plan is a healthy eating plan that has been shown to reduce high blood pressure (hypertension). It may also reduce your risk for type 2 diabetes, heart disease, and stroke. The DASH eating plan may also help with weight loss. What are  tips for following this plan?  General guidelines  Avoid eating more than 2,300 mg (milligrams) of salt (sodium) a day. If you have hypertension, you may need to reduce your sodium intake to 1,500 mg a day.  Limit alcohol intake to no more than 1 drink a day for nonpregnant women and 2 drinks a day for men. One drink equals 12 oz of beer, 5 oz of wine, or 1 oz of hard liquor.  Work with your health care provider to maintain a healthy body weight or to lose weight. Ask what an ideal weight is for you.  Get at least 30 minutes of exercise that causes your heart to beat faster (aerobic exercise) most days of the week. Activities may include walking, swimming, or biking.  Work with your health care provider or diet and nutrition specialist (dietitian) to adjust your eating plan to your individual calorie needs. Reading food labels   Check food labels for the amount of sodium per serving. Choose foods with less than 5 percent of the Daily Value of sodium. Generally, foods with less than 300 mg of sodium per serving fit into this eating plan.  To find whole grains, look for the word "whole" as the first word in the ingredient list. Shopping  Buy products labeled as "low-sodium" or "no salt added."  Buy fresh foods. Avoid canned foods and premade or frozen meals. Cooking  Avoid adding salt when cooking. Use salt-free seasonings or herbs instead of table salt or sea salt. Check with your health care provider or pharmacist before using salt substitutes.  Do not fry foods. Cook foods using healthy methods such as baking, boiling, grilling, and broiling instead.  Cook with heart-healthy oils, such as olive, canola, soybean, or sunflower oil. Meal planning  Eat a balanced diet that includes: ? 5 or more servings of fruits and vegetables each day. At each meal, try to fill half of your plate with fruits and vegetables. ? Up to 6-8 servings of whole grains each day. ? Less than 6 oz of lean  meat, poultry, or fish each day. A 3-oz serving of meat is about the same size as a deck of cards. One egg equals 1 oz. ? 2 servings of low-fat dairy each day. ? A serving of nuts, seeds, or beans 5 times each week. ? Heart-healthy fats. Healthy fats called Omega-3 fatty acids are found in foods such as flaxseeds and coldwater fish, like sardines, salmon, and mackerel.  Limit how much you eat of the following: ? Canned or prepackaged foods. ? Food that is high in trans fat, such as fried foods. ? Food that is high in saturated fat, such as fatty meat. ? Sweets, desserts, sugary drinks, and other foods with added sugar. ? Full-fat dairy products.  Do not salt foods before eating.  Try to eat at least 2 vegetarian meals each week.  Eat more home-cooked food and less restaurant, buffet, and fast food.  When eating at a restaurant, ask that your food be prepared with less salt or no salt, if possible. What foods are recommended? The items listed may not be a complete list. Talk with your dietitian about what dietary  choices are best for you. Grains Whole-grain or whole-wheat bread. Whole-grain or whole-wheat pasta. Brown rice. Orpah Cobb. Bulgur. Whole-grain and low-sodium cereals. Pita bread. Low-fat, low-sodium crackers. Whole-wheat flour tortillas. Vegetables Fresh or frozen vegetables (raw, steamed, roasted, or grilled). Low-sodium or reduced-sodium tomato and vegetable juice. Low-sodium or reduced-sodium tomato sauce and tomato paste. Low-sodium or reduced-sodium canned vegetables. Fruits All fresh, dried, or frozen fruit. Canned fruit in natural juice (without added sugar). Meat and other protein foods Skinless chicken or [place]. Ground chicken or [place]. Pork with fat trimmed off. Fish and seafood. Egg whites. Dried beans, peas, or lentils. Unsalted nuts, nut butters, and seeds. Unsalted canned beans. Lean cuts of beef with fat trimmed off. Low-sodium, lean deli  meat. Dairy Low-fat (1%) or fat-free (skim) milk. Fat-free, low-fat, or reduced-fat cheeses. Nonfat, low-sodium ricotta or cottage cheese. Low-fat or nonfat yogurt. Low-fat, low-sodium cheese. Fats and oils Soft margarine without trans fats. Vegetable oil. Low-fat, reduced-fat, or light mayonnaise and salad dressings (reduced-sodium). Canola, safflower, olive, soybean, and sunflower oils. Avocado. Seasoning and other foods Herbs. Spices. Seasoning mixes without salt. Unsalted popcorn and pretzels. Fat-free sweets. What foods are not recommended? The items listed may not be a complete list. Talk with your dietitian about what dietary choices are best for you. Grains Baked goods made with fat, such as croissants, muffins, or some breads. Dry pasta or rice meal packs. Vegetables Creamed or fried vegetables. Vegetables in a cheese sauce. Regular canned vegetables (not low-sodium or reduced-sodium). Regular canned tomato sauce and paste (not low-sodium or reduced-sodium). Regular tomato and vegetable juice (not low-sodium or reduced-sodium). Rosita Fire. Olives. Fruits Canned fruit in a light or heavy syrup. Fried fruit. Fruit in cream or butter sauce. Meat and other protein foods Fatty cuts of meat. Ribs. Fried meat. Tomasa Blase. Sausage. Bologna and other processed lunch meats. Salami. Fatback. Hotdogs. Bratwurst. Salted nuts and seeds. Canned beans with added salt. Canned or smoked fish. Whole eggs or egg yolks. Chicken or [place] with skin. Dairy Whole or 2% milk, cream, and half-and-half. Whole or full-fat cream cheese. Whole-fat or sweetened yogurt. Full-fat cheese. Nondairy creamers. Whipped toppings. Processed cheese and cheese spreads. Fats and oils Butter. Stick margarine. Lard. Shortening. Ghee. Bacon fat. Tropical oils, such as coconut, palm kernel, or palm oil. Seasoning and other foods Salted popcorn and pretzels. Onion salt, garlic salt, seasoned salt, table salt, and sea salt. Worcestershire  sauce. Tartar sauce. Barbecue sauce. Teriyaki sauce. Soy sauce, including reduced-sodium. Steak sauce. Canned and packaged gravies. Fish sauce. Oyster sauce. Cocktail sauce. Horseradish that you find on the shelf. Ketchup. Mustard. Meat flavorings and tenderizers. Bouillon cubes. Hot sauce and Tabasco sauce. Premade or packaged marinades. Premade or packaged taco seasonings. Relishes. Regular salad dressings. Where to find more information:  National Heart, Lung, and Blood Institute: PopSteam.is  American Heart Association: www.heart.org Summary  The DASH eating plan is a healthy eating plan that has been shown to reduce high blood pressure (hypertension). It may also reduce your risk for type 2 diabetes, heart disease, and stroke.  With the DASH eating plan, you should limit salt (sodium) intake to 2,300 mg a day. If you have hypertension, you may need to reduce your sodium intake to 1,500 mg a day.  When on the DASH eating plan, aim to eat more fresh fruits and vegetables, whole grains, lean proteins, low-fat dairy, and heart-healthy fats.  Work with your health care provider or diet and nutrition specialist (dietitian) to adjust your eating plan to your individual calorie  needs. This information is not intended to replace advice given to you by your health care provider. Make sure you discuss any questions you have with your health care provider. Document Revised: 11/06/2017 Document Reviewed: 11/17/2016 Elsevier Patient Education  2020 [employer].

## 2020-02-13 NOTE — Chronic Care Management (AMB) (Signed)
Chronic Care Management   Initial Visit Note  02/13/2020 Name: Oscar Jones MRN: [id number] DOB: [birth year]  Referred by: Verl Bangs, FNP Reason for referral : Care Coordination (Initial call: HTN/HLD/CKD)   Oscar Jones is a 49 y.o. year old male who is a primary care patient of [name], [name], FNP. The CCM team was consulted for assistance with chronic disease management and care coordination needs related to HTN, HLD and CKD.  Review of patient status, including review of consultants reports, relevant laboratory and other test results, and collaboration with appropriate care team members and the patient's provider was performed as part of comprehensive patient evaluation and provision of chronic care management services.    SDOH (Social Determinants of Health) assessments performed: Yes See Care Plan activities for detailed interventions related to SDOH)  SDOH Interventions     Most Recent Value  SDOH Interventions  SDOH Interventions for the Following Domains  Tobacco  Tobacco Interventions  Patient Refused  Alcohol Brief Interventions/Follow-up  Continued Monitoring, Referral [The patient drinks on the weekend, does not feel the need to stop, this is his outlet]       Medications: Outpatient Encounter Medications as of 02/13/2020  Medication Sig  . amLODipine (NORVASC) 10 MG tablet TAKE 1 TABLET(10 MG) BY MOUTH DAILY  . atorvastatin (LIPITOR) 40 MG tablet TAKE 1 TABLET(40 MG) BY MOUTH DAILY AT 6 PM  . carvedilol (COREG) 6.25 MG tablet Take 6.25 mg by mouth 2 (two) times daily with a meal.   . clobetasol ointment (TEMOVATE) 9.76 % Apply 1 application topically 2 (two) times daily.  . cloNIDine (CATAPRES) 0.1 MG tablet Take 1 tablet (0.1 mg total) by mouth 2 (two) times daily. (Patient not taking: Reported on 02/03/2020)  . halobetasol (ULTRAVATE) 0.05 % ointment Apply twice a day to affected areas  . hydrALAZINE (APRESOLINE) 100 MG tablet Take 100 mg by mouth every 8  (eight) hours.  . isosorbide mononitrate (IMDUR) 30 MG 24 hr tablet TAKE 1 TABLET(30 MG) BY MOUTH DAILY  . levothyroxine (SYNTHROID) 175 MCG tablet TAKE 1 TABLET(175 MCG) BY MOUTH DAILY BEFORE BREAKFAST  . olmesartan (BENICAR) 40 MG tablet TAKE 1 TABLET(40 MG) BY MOUTH DAILY   No facility-administered encounter medications on file as of 02/13/2020.     Objective:  BP Readings from Last 3 Encounters:  02/03/20 119/83  08/22/19 (!) 143/93  02/25/19 129/83    Goals Addressed            This Visit's Progress   . RNCM: PT-"I am making lifestyle changes to help me"       CARE PLAN ENTRY (see longtitudinal plan of care for additional care plan information)  Current Barriers:  . Chronic Disease Management support, education, and care coordination needs related to HTN, HLD, and CKD   Clinical Goal(s) related to HTN, HLD, and CKD:  Over the next 120 days, patient will:  . Work with the care management team to address educational, disease management, and care coordination needs  . Begin or continue self health monitoring activities as directed today Measure and record blood pressure 5 times per week and adhere to a Heart healthy/Carb modified  diet . Call provider office for new or worsened signs and symptoms Blood pressure findings outside established parameters, Chest pain, Shortness of breath, and New or worsened symptom related to Chronic health conditions . Call care management team with questions or concerns . Verbalize basic understanding of patient centered plan of care established today  Interventions  related to HTN, HLD, and CKD :  . Evaluation of current treatment plans and patient's adherence to plan as established by provider . Assessed patient understanding of disease states . Assessed patient's education and care coordination needs . Provided disease specific education to patient- education on Heart Healthy/Carb modified diet- will supply education through my chart  system . Collaborated with appropriate clinical care team members regarding patient needs- Referral to Pharmacist for assistance with med questions and some cost concerns.  Referral to LCSW for assistance with help as needed for education and support related to social habits . Secure chat with scheduler to get lab appointment changed from tomorrow to Thursday due to patient not having transportation in the am. . Assessed the patients social habits of using alcohol and smoking. The patient is working on smoking cessation. Smokes 1/2 pack per day for last 6 years. The patient states his consumption of alcohol is his outlet and he has to have something to get away from things at times. He does not drink during the week but does on the weekends. . Encouraged the patient to monitor his blood pressure as directed by the pcp. The patient verbalized he does not have a lot of time. Encouraged the patient to make changes that will help him improve his health and well being. Will continue to work with the patient to help with his needs.   Patient Self Care Activities related to HTN, HLD, and CKD :  . Patient is unable to independently self-manage chronic health conditions  Initial goal documentation         [name] was given information about Chronic Care Management services today including:  1. CCM service includes personalized support from designated clinical staff supervised by his physician, including individualized plan of care and coordination with other care providers 2. 24/7 contact phone numbers for assistance for urgent and routine care needs. 3. Service will only be billed when office clinical staff spend 20 minutes or more in a month to coordinate care. 4. Only one practitioner may furnish and bill the service in a calendar month. 5. The patient may stop CCM services at any time (effective at the end of the month) by phone call to the office staff. 6. The patient will be responsible for  cost sharing (co-pay) of up to 20% of the service fee (after annual deductible is met).  Patient agreed to services and verbal consent obtained.   Plan:   The care management team will reach out to the patient again over the next 60 days.   Noreene Larsson RN, MSN, [facility] Mobile: [phone]

## 2020-02-14 ENCOUNTER — Telehealth: Payer: Self-pay | Admitting: Family Medicine

## 2020-02-14 ENCOUNTER — Other Ambulatory Visit: Payer: Self-pay

## 2020-02-14 NOTE — Telephone Encounter (Signed)
Closing referral pending any other needs of patient. ° °Oscar Jones  °Care Guide • Embedded Care Coordination °[place]   Care Management °??[email or web address]   ??[phone]  ° ° ° ° °

## 2020-02-14 NOTE — Telephone Encounter (Signed)
   From: Manuela Schwartz [facility])  Sent: Tuesday, February 14, 2020 1:38 PM To: [email or web address] Subject: Secure: Community Resources Smoking Cessation Program and Helpline  Good Afternoon Oscar Jones,  Thank you for speaking with me regarding community resources for smoking cessation.  The [facility] is offering a class Jul 12 - Jul 09, 2020 Monday & Wednesday 5:30 PM - 6:30 PM the cost is Free. [email or web address]  [facility] is also offering a Online/Telephonic option beginning April 12 - Apr 09, 2020 MobileCycles.[email or web address]  I'd also encourage you to call your [employer] company to see if they provide assistance through your insurance program.  The Centers for Disease Control and Prevention has a quit line for more resources 1-800-QUIT-NOW. SuperbApps.be  Please let me know if you need any further assistance,  [employer]  Care Guide . Embedded Care Coordination [place]  Care Management ??[email or web address]  ??[phone]

## 2020-02-14 NOTE — Telephone Encounter (Signed)
   02/14/2020  Name: Oscar Jones   MRN: [id number]   DOB: [birth year]   AGE: 49 y.o.   GENDER: male   PCP [place], Jodelle Gross, FNP.   Called pt regarding Community Resource Referral for Smoking Cessation. Pt has [employer]. Pt asked that I email him resources for smoking cessation to his email address [email or web address] Closing referral pending any other needs of patient.  Manuela Schwartz  Care Guide . Embedded Care Coordination [facility] Management [email or web address]  [phone]

## 2020-02-14 NOTE — Telephone Encounter (Signed)
error 

## 2020-02-16 ENCOUNTER — Other Ambulatory Visit: Payer: Self-pay

## 2020-02-19 ENCOUNTER — Other Ambulatory Visit: Payer: Self-pay | Admitting: Family Medicine

## 2020-02-19 DIAGNOSIS — E890 Postprocedural hypothyroidism: Secondary | ICD-10-CM

## 2020-02-19 DIAGNOSIS — I10 Essential (primary) hypertension: Secondary | ICD-10-CM

## 2020-02-22 ENCOUNTER — Ambulatory Visit: Payer: Self-pay | Admitting: Pharmacist

## 2020-02-22 NOTE — Patient Instructions (Addendum)
Thank you allowing the Care Management Team to be a part of your care! It was a pleasure speaking with you today!     Care Management Team    Alto Denver RN, MSN, CCM Nurse Care Coordinator  [phone]   Duanne Moron PharmD  Clinical Pharmacist  [phone]   Dickie La LCSW Clinical Social Worker [phone]   Visit Information  Goals Addressed            This Visit's Progress   . PharmD - Medication Management       CARE PLAN ENTRY (see longtitudinal plan of care for additional care plan information)  Current Barriers:  . Chronic Disease Management support, education, and care coordination needs related to HTN, hypothyroidism, CKD, prediabetes, discoid lupus erythematosus and Vitamin D deficiency  . Lack of blood pressure results for clinical team  Pharmacist Clinical Goal(s):  Marland Kitchen Over the next 30 days, patient will work with CM Pharmacist and PCP to address needs related to HTN management and medication regimen optimization  Interventions: . Comprehensive medication review performed; medication list updated in electronic medical record . Counsel on importance of blood pressure control and monitoring o Reports taking: - Amlodipine 10 mg once daily - Carvedilol 6.25 mg twice daily - Hydralazine 100 mg three times daily - Isosorbide ER 30 mg once daily - Olmesartan 40 mg once daily o Reports not taking clonidine. Reports having been off of clonidine > 6 months o Confirms having home upper arm BP monitor, but denies having checked home BP recently o Encourage patient to start monitoring home BP twice daily, keeping log and to bring log with him to medical appointments o Counsel on BP monitoring technique o Will mail patient BP monitoring log and written education on BP monitoring technique as requested. . Discuss impact of exercise and healthy diet on blood pressure and blood sugar o Counsel patient on importance of maintaining a balanced diet  o Patient  reports having made substantial changes to his diet over past few weeks and expresses interest in making further changes, particularly to control blood sugar since lasts lab work showed prediabetes level A1C, 6.1%. o Reports typical diet: - Breakfast: Coffee, oatmeal (small portion);  - Morning snack: Banana and orange - Lunch: Southwest-style salad  - Afternoon snack: Pecans/walnuts - Supper: Broccoli/string beans and salmon - Reports drinks water throughout day, after AM coffee o Counsel on reading and interpreting nutrition labels o Patient reports adhering to a low-sodium diet o Will mail patient written material on prediabetes and meal planning as requested o Patient reports current exercise comes from being active at work (including lifting and walking), but no additional exercise o Reports planning to increase exercise by taking daughter for walks on weekends . Counsel on Vitamin D supplementation o Per chart review, Vitamin D level on 3/1 was 13 ng/mL and patient advised by PCP to start OTC Vitamin D supplement o Patient denies having started Vitamin D supplementation o Reports has Vitamin D (2,000 IU) OTC supplement at home o Encourage patient to start taking this Vitamin D supplement once daily as directed . Counsel on importance of medication adherence.  o Patient denies missed doses of medication o Counsel on benefits of using weekly pillbox and alarms on phone as adherence aids  - Patient denies interest in using at this time. o Counsel on importance of storing medication away from heat, light and moisture  Patient Self Care Activities:  . Patient verbalizes understanding of plan to check  BP twice daily and keep log . Calls provider office for new concerns or questions  Initial goal documentation        Patient verbalizes understanding of instructions provided today.   Telephone follow up appointment with care management team member scheduled for: 4/5 at 1  pm  Harlow Asa, PharmD, [place] [phone]    Prediabetes Eating Plan Prediabetes is a condition that causes blood sugar (glucose) levels to be higher than normal. This increases the risk for developing diabetes. In order to prevent diabetes from developing, your health care provider may recommend a diet and other lifestyle changes to help you:  Control your blood glucose levels.  Improve your cholesterol levels.  Manage your blood pressure. Your health care provider may recommend working with a diet and nutrition specialist (dietitian) to make a meal plan that is best for you. What are tips for following this plan? Lifestyle  Set weight loss goals with the help of your health care team. It is recommended that most people with prediabetes lose 7% of their current body weight.  Exercise for at least 30 minutes at least 5 days a week.  Attend a support group or seek ongoing support from a mental health counselor.  Take over-the-counter and prescription medicines only as told by your health care provider. Reading food labels  Read food labels to check the amount of fat, salt (sodium), and sugar in prepackaged foods. Avoid foods that have: ? Saturated fats. ? Trans fats. ? Added sugars.  Avoid foods that have more than 300 milligrams (mg) of sodium per serving. Limit your daily sodium intake to less than 2,300 mg each day. Shopping  Avoid buying pre-made and processed foods. Cooking  Cook with olive oil. Do not use butter, lard, or ghee.  Bake, broil, grill, or boil foods. Avoid frying. Meal planning   Work with your dietitian to develop an eating plan that is right for you. This may include: ? Tracking how many calories you take in. Use a food diary, notebook, or mobile application to track what you eat at each meal. ? Using the glycemic index (GI) to plan your meals. The index tells you how  quickly a food will raise your blood glucose. Choose low-GI foods. These foods take a longer time to raise blood glucose.  Consider following a Mediterranean diet. This diet includes: ? Several servings each day of fresh fruits and vegetables. ? Eating fish at least twice a week. ? Several servings each day of whole grains, beans, nuts, and seeds. ? Using olive oil instead of other fats. ? Moderate alcohol consumption. ? Eating small amounts of red meat and whole-fat dairy.  If you have high blood pressure, you may need to limit your sodium intake or follow a diet such as the DASH eating plan. DASH is an eating plan that aims to lower high blood pressure. What foods are recommended? The items listed below may not be a complete list. Talk with your dietitian about what dietary choices are best for you. Grains Whole grains, such as whole-wheat or whole-grain breads, crackers, cereals, and pasta. Unsweetened oatmeal. Bulgur. Barley. Quinoa. Brown rice. Corn or whole-wheat flour tortillas or taco shells. Vegetables Lettuce. Spinach. Peas. Beets. Cauliflower. Cabbage. Broccoli. Carrots. Tomatoes. Squash. Eggplant. Herbs. Peppers. Onions. Cucumbers. Brussels sprouts. Fruits Berries. Bananas. Apples. Oranges. Grapes. Papaya. Mango. Pomegranate. Kiwi. Grapefruit. Cherries. Meats and other protein foods Seafood. Poultry without skin. Lean cuts of pork and beef.  Tofu. Eggs. Nuts. Beans. Dairy Low-fat or fat-free dairy products, such as yogurt, cottage cheese, and cheese. Beverages Water. Tea. Coffee. Sugar-free or diet soda. Seltzer water. Lowfat or no-fat milk. Milk alternatives, such as soy or almond milk. Fats and oils Olive oil. Canola oil. Sunflower oil. Grapeseed oil. Avocado. Walnuts. Sweets and desserts Sugar-free or low-fat pudding. Sugar-free or low-fat ice cream and other frozen treats. Seasoning and other foods Herbs. Sodium-free spices. Mustard. Relish. Low-fat, low-sugar ketchup.  Low-fat, low-sugar barbecue sauce. Low-fat or fat-free mayonnaise. What foods are not recommended? The items listed below may not be a complete list. Talk with your dietitian about what dietary choices are best for you. Grains Refined white flour and flour products, such as bread, pasta, snack foods, and cereals. Vegetables Canned vegetables. Frozen vegetables with butter or cream sauce. Fruits Fruits canned with syrup. Meats and other protein foods Fatty cuts of meat. Poultry with skin. Breaded or fried meat. Processed meats. Dairy Full-fat yogurt, cheese, or milk. Beverages Sweetened drinks, such as sweet iced tea and soda. Fats and oils Butter. Lard. Ghee. Sweets and desserts Baked goods, such as cake, cupcakes, pastries, cookies, and cheesecake. Seasoning and other foods Spice mixes with added salt. Ketchup. Barbecue sauce. Mayonnaise. Summary  To prevent diabetes from developing, you may need to make diet and other lifestyle changes to help control blood sugar, improve cholesterol levels, and manage your blood pressure.  Set weight loss goals with the help of your health care team. It is recommended that most people with prediabetes lose 7 percent of their current body weight.  Consider following a Mediterranean diet that includes plenty of fresh fruits and vegetables, whole grains, beans, nuts, seeds, fish, lean meat, low-fat dairy, and healthy oils. This information is not intended to replace advice given to you by your health care provider. Make sure you discuss any questions you have with your health care provider. Document Revised: 03/18/2019 Document Reviewed: 01/28/2017 Elsevier Patient Education  2020 [employer].

## 2020-02-22 NOTE — Chronic Care Management (AMB) (Signed)
Care Management   Initial Visit Note  02/22/2020 Name: Oscar Jones MRN: [id number] DOB: [birth year]  Subjective:    Oscar Jones is a 49 y.o. year old male who sees Malfi, [name], FNP for primary care. The care management team was consulted for assistance with care management and care coordination needs related to Medication Management and Education   Outreach to [facility] today by phone.   Review of patient status, including review of consultants reports, relevant laboratory and other test results, and collaboration with appropriate care team members and the patient's provider was performed as part of comprehensive patient evaluation and provision of care management services.    SDOH (Social Determinants of Health) assessments performed: No See Care Plan activities for detailed interventions related to SDOH)    Objective:  ASCVD Risk The 10-year ASCVD risk score [name], et al., 2013) is: 11.9%   Values used to calculate the score:     Age: 73 years     Sex: Male     Is Non-Hispanic African American: Yes     Diabetic: No     Tobacco smoker: Yes     Systolic Blood Pressure: 948 mmHg     Is BP treated: Yes     HDL Cholesterol: 58 mg/dL     Total Cholesterol: 174 mg/dL     Lab Results  Component Value Date   CREATININE 1.29 02/06/2020   CREATININE 1.47 (H) 08/23/2019   CREATININE 1.58 (H) 01/31/2019  Calculated CrCl > 60 mL/min   Lab Results  Component Value Date   HGBA1C 6.1 (H) 02/06/2020   Lab Results  Component Value Date   CHOL 174 02/06/2020   HDL 58 02/06/2020   LDLCALC 99 02/06/2020   TRIG 83 02/06/2020   CHOLHDL 3.0 02/06/2020    Outpatient Encounter Medications as of 02/22/2020  Medication Sig  . amLODipine (NORVASC) 10 MG tablet TAKE 1 TABLET(10 MG) BY MOUTH DAILY  . atorvastatin (LIPITOR) 40 MG tablet TAKE 1 TABLET(40 MG) BY MOUTH DAILY AT 6 PM  . carvedilol (COREG) 6.25 MG tablet Take 6.25 mg by mouth 2 (two) times daily with a  meal.   . halobetasol (ULTRAVATE) 0.05 % ointment Apply twice a day to affected areas  . hydrALAZINE (APRESOLINE) 100 MG tablet Take 100 mg by mouth every 8 (eight) hours.  . isosorbide mononitrate (IMDUR) 30 MG 24 hr tablet TAKE 1 TABLET(30 MG) BY MOUTH DAILY  . levothyroxine (SYNTHROID) 175 MCG tablet TAKE 1 TABLET(175 MCG) BY MOUTH DAILY BEFORE BREAKFAST  . olmesartan (BENICAR) 40 MG tablet TAKE 1 TABLET(40 MG) BY MOUTH DAILY  . Vitamin D, Cholecalciferol, 50 MCG (2000 UT) CAPS Take 1 capsule by mouth daily.  . cloNIDine (CATAPRES) 0.1 MG tablet Take 1 tablet (0.1 mg total) by mouth 2 (two) times daily. (Patient not taking: Reported on 02/03/2020)  . [DISCONTINUED] clobetasol ointment (TEMOVATE) 5.46 % Apply 1 application topically 2 (two) times daily. (Patient not taking: Reported on 02/22/2020)   No facility-administered encounter medications on file as of 02/22/2020.   Assessment:  Goals Addressed            This Visit's Progress   . PharmD - Medication Management       CARE PLAN ENTRY (see longtitudinal plan of care for additional care plan information)  Current Barriers:  . Chronic Disease Management support, education, and care coordination needs related to HTN, hypothyroidism, CKD, prediabetes, discoid lupus erythematosus and Vitamin D deficiency  . Lack of  blood pressure results for clinical team  Pharmacist Clinical Goal(s):  Marland Kitchen Over the next 30 days, patient will work with CM Pharmacist and PCP to address needs related to HTN management and medication regimen optimization  Interventions: . Comprehensive medication review performed; medication list updated in electronic medical record . Counsel on importance of blood pressure control and monitoring o Reports taking: - Amlodipine 10 mg once daily - Carvedilol 6.25 mg twice daily - Hydralazine 100 mg three times daily - Isosorbide ER 30 mg once daily - Olmesartan 40 mg once daily o Reports not taking clonidine. Reports  having been off of clonidine > 6 months o Confirms having home upper arm BP monitor, but denies having checked home BP recently o Encourage patient to start monitoring home BP twice daily, keeping log and to bring log with him to medical appointments o Counsel on BP monitoring technique o Will mail patient BP monitoring log and written education on BP monitoring technique as requested. . Discuss impact of exercise and healthy diet on blood pressure and blood sugar o Counsel patient on importance of maintaining a balanced diet  o Patient reports having made substantial changes to his diet over past few weeks and expresses interest in making further changes, particularly to control blood sugar since lasts lab work showed prediabetes level A1C, 6.1%. o Reports typical diet: - Breakfast: Coffee, oatmeal (small portion);  - Morning snack: Banana and orange - Lunch: Southwest-style salad  - Afternoon snack: Pecans/walnuts - Supper: Broccoli/string beans and salmon - Reports drinks water throughout day, after AM coffee o Counsel on reading and interpreting nutrition labels o Patient reports adhering to a low-sodium diet o Will mail patient written material on prediabetes and meal planning as requested o Patient reports current exercise comes from being active at work (including lifting and walking), but no additional exercise o Reports planning to increase exercise by taking daughter for walks on weekends . Counsel on Vitamin D supplementation o Per chart review, Vitamin D level on 3/1 was 13 ng/mL and patient advised by PCP to start OTC Vitamin D supplement o Patient denies having started Vitamin D supplementation o Reports has Vitamin D (2,000 IU) OTC supplement at home o Encourage patient to start taking this Vitamin D supplement once daily as directed . Counsel on importance of medication adherence.  o Patient denies missed doses of medication o Counsel on benefits of using weekly pillbox and  alarms on phone as adherence aids  - Patient denies interest in using at this time. o Counsel on importance of storing medication away from heat, light and moisture  Patient Self Care Activities:  . Patient verbalizes understanding of plan to check BP twice daily and keep log . Calls provider office for new concerns or questions  Initial goal documentation         Follow up plan:  Telephone follow up appointment with care management team member scheduled for: 4/5 at 1 pm  Duanne Moron, PharmD, [facility] Clinical Pharmacist [facility] [employer] [phone]

## 2020-02-24 ENCOUNTER — Encounter: Payer: Self-pay | Admitting: Family Medicine

## 2020-02-24 ENCOUNTER — Other Ambulatory Visit: Payer: Self-pay

## 2020-02-24 ENCOUNTER — Ambulatory Visit: Payer: 59 | Admitting: Family Medicine

## 2020-02-24 VITALS — BP 118/79 | HR 72 | Temp 97.7°F | Ht 72.0 in | Wt 307.4 lb

## 2020-02-24 DIAGNOSIS — G4733 Obstructive sleep apnea (adult) (pediatric): Secondary | ICD-10-CM

## 2020-02-24 DIAGNOSIS — I10 Essential (primary) hypertension: Secondary | ICD-10-CM | POA: Diagnosis not present

## 2020-02-24 DIAGNOSIS — I129 Hypertensive chronic kidney disease with stage 1 through stage 4 chronic kidney disease, or unspecified chronic kidney disease: Secondary | ICD-10-CM | POA: Diagnosis not present

## 2020-02-24 DIAGNOSIS — R7303 Prediabetes: Secondary | ICD-10-CM | POA: Diagnosis not present

## 2020-02-24 DIAGNOSIS — N183 Chronic kidney disease, stage 3 unspecified: Secondary | ICD-10-CM

## 2020-02-24 MED ORDER — AMLODIPINE BESYLATE 10 MG PO TABS: ORAL_TABLET | ORAL | 0 refills | Status: DC

## 2020-02-24 MED ORDER — CLONIDINE HCL 0.1 MG PO TABS: 0.1000 mg | ORAL_TABLET | Freq: Two times a day (BID) | ORAL | 5 refills | Status: AC

## 2020-02-24 NOTE — Progress Notes (Signed)
Subjective:    Patient ID: Oscar Jones, male    DOB: 1971/08/14, 49 y.o.   MRN: [id number]  Oscar Jones is a 49 y.o. male presenting on 02/24/2020 for Consult (pt questioning the diagnoses of sleep apnea documented in his chart. He states that he was never diagnose with sleep apnea in [place]. He's also questining the diagnose of diabetes and kidney faliure. He state with these diagnoses he cannot get life insurances in the state of [place]. ) and Hypertension   HPI  Hypertension - He is checking BP at home or outside of clinic.  Readings 110's SBP/70's DBP - Current medications: amlodipine 10mg  daily, clonidine 0.1mg  twice daily, hydralazine 100mg  tablet 3x a day, olmesartan 40mg  tablet daily tolerating with side effects has been having low blood pressures recently. - He is not currently symptomatic. - Pt denies headache, lightheadedness, dizziness, changes in vision, chest tightness/pressure, palpitations, leg swelling, sudden loss of speech or loss of consciousness. - He  reports no regular exercise routine. - His diet is moderate in salt, moderate in fat, and moderate in carbohydrates.   Depression screen [facility] 2/9 02/13/2020 02/03/2020 12/02/2018  Decreased Interest 0 0 0  Down, Depressed, Hopeless 0 0 0  PHQ - 2 Score 0 0 0    Social History   Tobacco Use  . Smoking status: Current Every Day Smoker    Packs/day: 0.50    Types: Cigarettes  . Smokeless tobacco: Never Used  Substance Use Topics  . Alcohol use: Yes    Alcohol/week: 2.0 standard drinks    Types: 2 Cans of beer per week    Comment: 2 x 32 oz cans weekend, pint vodka every 3 weeks  . Drug use: No    Review of Systems  Constitutional: Negative.   HENT: Negative.   Eyes: Negative.   Respiratory: Negative.   Cardiovascular: Negative.   Gastrointestinal: Negative.   Endocrine: Negative.   Genitourinary: Negative.   Musculoskeletal: Negative.   Skin: Negative.   Allergic/Immunologic: Negative.     Neurological: Negative.   Hematological: Negative.   Psychiatric/Behavioral: Negative.    Per HPI unless specifically indicated above     Objective:    BP 118/79 (BP Location: Right Arm, Patient Position: Sitting, Cuff Size: Large)   Pulse 72   Temp 97.7 F (36.5 C) (Temporal)   Ht 6' (1.829 m)   Wt (!) 307 lb 6.4 oz (139.4 kg)   BMI 41.69 kg/m   Wt Readings from Last 3 Encounters:  02/24/20 (!) 307 lb 6.4 oz (139.4 kg)  02/03/20 (!) 311 lb 9.6 oz (141.3 kg)  08/22/19 (!) 307 lb (139.3 kg)    Physical Exam Vitals reviewed.  Constitutional:      General: He is not in acute distress.    Appearance: Normal appearance. He is well-groomed. He is obese. He is not ill-appearing or toxic-appearing.  HENT:     Head: Normocephalic.  Eyes:     General:        Right eye: No discharge.        Left eye: No discharge.     Extraocular Movements: Extraocular movements intact.     Conjunctiva/sclera: Conjunctivae normal.     Pupils: Pupils are equal, round, and reactive to light.  Cardiovascular:     Rate and Rhythm: Normal rate and regular rhythm.     Pulses: Normal pulses.     Heart sounds: Normal heart sounds. No murmur. No friction rub. No gallop.   Pulmonary:  Effort: Pulmonary effort is normal. No respiratory distress.     Breath sounds: Normal breath sounds.  Skin:    General: Skin is warm and dry.     Capillary Refill: Capillary refill takes less than 2 seconds.  Neurological:     General: No focal deficit present.     Mental Status: He is alert and oriented to person, place, and time.     Cranial Nerves: No cranial nerve deficit.     Sensory: No sensory deficit.     Motor: No weakness.     Coordination: Coordination normal.     Gait: Gait normal.  Psychiatric:        Attention and Perception: Attention and perception normal.        Mood and Affect: Mood and affect normal.        Speech: Speech normal.        Behavior: Behavior normal. Behavior is cooperative.         Thought Content: Thought content normal.        Cognition and Memory: Cognition and memory normal.     Results for orders placed or performed in visit on 02/03/20  CBC with Differential  Result Value Ref Range   WBC 5.1 3.8 - 10.8 Thousand/uL   RBC 4.89 4.20 - 5.80 Million/uL   Hemoglobin 15.3 13.2 - 17.1 g/dL   HCT 74.0 81.4 - 48.1 %   MCV 92.6 80.0 - 100.0 fL   MCH 31.3 27.0 - 33.0 pg   MCHC 33.8 32.0 - 36.0 g/dL   RDW 85.6 (H) 31.4 - 97.0 %   Platelets 172 140 - 400 Thousand/uL   MPV 11.3 7.5 - 12.5 fL   Neutro Abs 2,239 1,500 - 7,800 cells/uL   Lymphs Abs 2,015 850 - 3,900 cells/uL   Absolute Monocytes 663 200 - 950 cells/uL   Eosinophils Absolute 143 15 - 500 cells/uL   Basophils Absolute 41 0 - 200 cells/uL   Neutrophils Relative % 43.9 %   Total Lymphocyte 39.5 %   Monocytes Relative 13.0 %   Eosinophils Relative 2.8 %   Basophils Relative 0.8 %  COMPLETE METABOLIC PANEL WITH GFR  Result Value Ref Range   Glucose, Bld 96 65 - 99 mg/dL   BUN 20 7 - 25 mg/dL   Creat 2.63 7.85 - 8.85 mg/dL   GFR, Est Non African American 64 > OR = 60 mL/min/1.68m2   GFR, Est African American 74 > OR = 60 mL/min/1.42m2   BUN/Creatinine Ratio NOT APPLICABLE 6 - 22 (calc)   Sodium 139 135 - 146 mmol/L   Potassium 4.3 3.5 - 5.3 mmol/L   Chloride 108 98 - 110 mmol/L   CO2 23 20 - 32 mmol/L   Calcium 9.4 8.6 - 10.3 mg/dL   Total Protein 7.1 6.1 - 8.1 g/dL   Albumin 3.9 3.6 - 5.1 g/dL   Globulin 3.2 1.9 - 3.7 g/dL (calc)   AG Ratio 1.2 1.0 - 2.5 (calc)   Total Bilirubin 0.4 0.2 - 1.2 mg/dL   Alkaline phosphatase (APISO) 63 35 - 144 U/L   AST 17 10 - 35 U/L   ALT 19 9 - 46 U/L  Lipid Profile  Result Value Ref Range   Cholesterol 174 <200 mg/dL   HDL 58 > OR = 40 mg/dL   Triglycerides 83 <027 mg/dL   LDL Cholesterol (Calc) 99 mg/dL (calc)   Total CHOL/HDL Ratio 3.0 <5.0 (calc)   Non-HDL Cholesterol (Calc) 116 <130  mg/dL (calc)  Thyroid Panel With TSH  Result Value Ref Range    T3 Uptake 30 22 - 35 %   T4, Total 12.4 (H) 4.9 - 10.5 mcg/dL   Free Thyroxine Index 3.7 1.4 - 3.8   TSH 0.48 0.40 - 4.50 mIU/L  Vitamin D (25 hydroxy)  Result Value Ref Range   Vit D, 25-Hydroxy 13 (L) 30 - 100 ng/mL  PSA  Result Value Ref Range   PSA 0.5 < OR = 4.0 ng/mL  HgB A1c  Result Value Ref Range   Hgb A1c MFr Bld 6.1 (H) <5.7 % of total Hgb   Mean Plasma Glucose 128 (calc)   eAG (mmol/L) 7.1 (calc)      Assessment & Plan:   Problem List Items Addressed This Visit      Cardiovascular and Mediastinum   Accelerated hypertension   Relevant Medications   cloNIDine (CATAPRES) 0.1 MG tablet   amLODipine (NORVASC) 10 MG tablet   Essential hypertension    Stable and well controlled hypertension.  BP goal < 130/80.  Pt working on lifestyle modifications.  Taking medications and finding that sometimes his blood pressure readings are low and he will have some dizziness.  Has recently stopped his coreg 6.25mg  twice daily and his blood pressure has maintained well around 110's SBP/70's DBP.  Will be getting a new blood pressure cuff and machine this week.  Once gets new blood pressure machine and cuff, will stop his amlodipine and take his BP twice daily.  If staying under 130/80 consistently, we will plan to discontinue this medication.  If his blood pressures are elevating, will look into Cardizem, as the amlodipine was increasing his nocturia.   Plan: 1. Continue taking clonidine 0.1mg  twice daily, hydralazine 100mg  tablet 3x a day, and olmesartan 40mg  daily.  Will continue amlodipine 10mg  daily until receives new blood pressure machine, and will have trial off medication with twice daily blood pressure checks. 2. Obtain labs before next visit  3. Encouraged heart healthy diet and increasing exercise to 30 minutes most days of the week, going no more than 2 days in a row without exercise. 4. Check BP 1-2 x per day at home, keep log, and bring to clinic at next appointment. 5.  Follow up via phone in 2 weeks to discuss blood pressure readings and in 3 months for next set of labs and in office visit.        Relevant Medications   cloNIDine (CATAPRES) 0.1 MG tablet   amLODipine (NORVASC) 10 MG tablet     Respiratory   OSA (obstructive sleep apnea) - Primary    Reports history of CPAP usage and OSA, but reports that was many years ago, that he has been sleeping without CPAP and without any snoring, periods of apnea, choking/coughing while sleeping.  Requesting new sleep study to see if OSA has resolved.  If has not resolved, interested in looking into mouthguard for OSA through sleep medicine dentist.  Sleep Study ordered      Relevant Orders   Nocturnal polysomnography (NPSG)     Genitourinary   Hypertensive CKD (chronic kidney disease)    Currently on ARB - Olmesartan 40mg  tablet daily for kidney protection. Labs on 02/06/2020 show no chronic kidney disease.        Other   Prediabetes    No formal diagnosis of Diabetes.  A1C has ranged from 6.1% on 02/06/2020, 5.8% on 01/31/2019, and 6.0% on 10/21/2018.  Well within range of  prediabetes, well controlled with diet and exercise.  Reviewed heart healthy diet, such as "The Plant Paradox" by Dr. Therisa Doyne.  Will repeat labs at next visit in 3 months.         Meds ordered this encounter  Medications  . cloNIDine (CATAPRES) 0.1 MG tablet    Sig: Take 1 tablet (0.1 mg total) by mouth 2 (two) times daily.    Dispense:  60 tablet    Refill:  5  . amLODipine (NORVASC) 10 MG tablet    Sig: TAKE 1 TABLET(10 MG) BY MOUTH DAILY    Dispense:  15 tablet    Refill:  0      Follow up plan: Return in about 3 months (around 05/26/2020) for HTN F/U in person and in 2 weeks via telephone to review BP readings.   Harlin Rain, [place] Family Nurse Practitioner [facility] 02/24/2020, 10:29 AM

## 2020-02-24 NOTE — Assessment & Plan Note (Signed)
Reports history of CPAP usage and OSA, but reports that was many years ago, that he has been sleeping without CPAP and without any snoring, periods of apnea, choking/coughing while sleeping.  Requesting new sleep study to see if OSA has resolved.  If has not resolved, interested in looking into mouthguard for OSA through sleep medicine dentist.  Sleep Study ordered

## 2020-02-24 NOTE — Patient Instructions (Signed)
As we discussed, pick up a new blood pressure cuff and begin to take your blood pressure 2x a day.  We will plan to hold your amlodipine for 2 weeks while you are taking your blood pressure readings.  If you're pressures are staying over 140/90 we will add in a Cardizem daily in place of your amlodipine.  Can look into "The Plant Paradox" to look at different dietary ways of eating for reducing inflammation and heart health.  We will plan to see you back in clinic in 3 months but will touch base in 2 weeks to review your blood pressure.  I have ordered a repeat sleep study for you and we can discuss treatment options once completed.  Try to get exercise a minimum of 30 minutes per day at least 5 days per week as well as  adequate water intake all while measuring blood pressure a few times per week.  Keep a blood pressure log and bring back to clinic at your next visit.  If your readings are consistently over 140/90 to contact our office/send me a MyChart message and we will see you sooner.  Can try DASH and Mediterranean diet options, avoiding processed foods, lowering sodium intake, avoiding pork products, and eating a plant based diet for optimal health.  Education and discussion with patient regarding hypertension as well as the effects on the organs and body.  Specifically, we spoke about kidney disease, kidney failure, heart attack, stroke and up to and including death, as likely outcomes if non-compliant with blood pressure regulation.  Discussed how all of these habits are attached to each other and each has the effect on each other.  You will receive a survey after today's visit either digitally by e-mail or paper by [employer]. Your experiences and feedback matter to [place].  Please respond so we know how we are doing as we provide care for you.  Call us with any questions/concerns/needs.  It is my goal to be available to you for your health concerns.  Thanks for choosing me to be a partner in  your healthcare needs!  Charlaine Dalton, FNP-C Family Nurse Practitioner [facility] Phone: [phone]

## 2020-02-24 NOTE — Assessment & Plan Note (Signed)
No formal diagnosis of Diabetes.  A1C has ranged from 6.1% on 02/06/2020, 5.8% on 01/31/2019, and 6.0% on 10/21/2018.  Well within range of prediabetes, well controlled with diet and exercise.  Reviewed heart healthy diet, such as "The Plant Paradox" by Dr. Delaney Meigs.  Will repeat labs at next visit in 3 months.

## 2020-02-24 NOTE — Assessment & Plan Note (Signed)
Stable and well controlled hypertension.  BP goal < 130/80.  Pt working on lifestyle modifications.  Taking medications and finding that sometimes his blood pressure readings are low and he will have some dizziness.  Has recently stopped his coreg 6.25mg  twice daily and his blood pressure has maintained well around 110's SBP/70's DBP.  Will be getting a new blood pressure cuff and machine this week.  Once gets new blood pressure machine and cuff, will stop his amlodipine and take his BP twice daily.  If staying under 130/80 consistently, we will plan to discontinue this medication.  If his blood pressures are elevating, will look into Cardizem, as the amlodipine was increasing his nocturia.   Plan: 1. Continue taking clonidine 0.1mg  twice daily, hydralazine 100mg  tablet 3x a day, and olmesartan 40mg  daily.  Will continue amlodipine 10mg  daily until receives new blood pressure machine, and will have trial off medication with twice daily blood pressure checks. 2. Obtain labs before next visit  3. Encouraged heart healthy diet and increasing exercise to 30 minutes most days of the week, going no more than 2 days in a row without exercise. 4. Check BP 1-2 x per day at home, keep log, and bring to clinic at next appointment. 5. Follow up via phone in 2 weeks to discuss blood pressure readings and in 3 months for next set of labs and in office visit.

## 2020-02-24 NOTE — Assessment & Plan Note (Signed)
Currently on ARB - Olmesartan 40mg  tablet daily for kidney protection. Labs on 02/06/2020 show no chronic kidney disease.

## 2020-02-27 ENCOUNTER — Telehealth: Payer: Self-pay

## 2020-02-27 NOTE — Telephone Encounter (Signed)
The pt sent this in a mychart message. Good morning Miss Oscar Jones how are you this morning you don't have to respond right away to me I just want to tell you what the blood pressure numbers were this morning I was 133 over 98 post was 75 I don't know if this is good just let me know thank you for your time to call I know you're busy I really appreciate you being my doctor you are very nice person

## 2020-02-27 NOTE — Telephone Encounter (Signed)
Have him continue to check his blood pressure 2x a day.  If he has consistent numbers of greater than 140 SBP or 90 DBP, we may switch out the amlodipine for cardizem before the 2 week follow up mark.  Have him keep a log of his blood pressures and follow up as we discussed in 2 weeks.  Thanks

## 2020-02-29 NOTE — Telephone Encounter (Signed)
This is a new blood pressure cuff the patient recently purchased. I notified him that he can come in the office to get his blood pressure checked. He said he will get back in contact with [place] to schedule a Nurse/CMA visit to come in and get his bp checked.

## 2020-02-29 NOTE — Telephone Encounter (Signed)
Is this a new blood pressure cuff that he has purchased since we met at his last office visit?  If it is and he does not believe that it is accurate, we can always have him come in for a BP check in office.  If it is not a new cuff since his last visit, we can always find out what resources are available through his insurance and he has CCM on his file and we can request if there are any covered and available.

## 2020-02-29 NOTE — Telephone Encounter (Signed)
The pt state his blood pressure is ranging 140/90 , but admits he's had some higher readings. He feel like his new blood pressure cuff is not accurate. He state that he check his wife and daughter blood pressure with the cuff and everybody had the same reading.  He currently have not made any changes to his bp medications, because of the elevated reading he is getting. He state that he doesn't wish to purchase another machine. Please advise

## 2020-03-02 ENCOUNTER — Encounter: Payer: Self-pay | Admitting: Family Medicine

## 2020-03-07 ENCOUNTER — Ambulatory Visit: Payer: Self-pay | Admitting: Pharmacist

## 2020-03-07 DIAGNOSIS — I10 Essential (primary) hypertension: Secondary | ICD-10-CM

## 2020-03-07 NOTE — Patient Instructions (Signed)
Thank you allowing the Care Management Team to be a part of your care! It was a pleasure speaking with you today!     Care Management Team    Alto Denver RN, MSN, CCM Nurse Care Coordinator  [phone]   Duanne Moron PharmD  Clinical Pharmacist  [phone]   Dickie La LCSW Clinical Social Worker [phone]   Visit Information  Goals Addressed            This Visit's Progress   . PharmD - Medication Management       CARE PLAN ENTRY (see longtitudinal plan of care for additional care plan information)  Current Barriers:  . Chronic Disease Management support, education, and care coordination needs related to HTN, hypothyroidism, CKD, prediabetes, discoid lupus erythematosus and Vitamin D deficiency  . Lack of blood pressure results for clinical team  Pharmacist Clinical Goal(s):  Marland Kitchen Over the next 30 days, patient will work with CM Pharmacist and PCP to address needs related to HTN management and medication regimen optimization  Interventions: . Follow up call to Mr. Rijos to address patient's questions regarding BP monitoring o Mr. Schaible reports that he obtained a new upper arm BP monitor, but was unable to locate a monitor with large cuff size - Note from chart and patient reports requires large cuff size.  - Counsel on places for patient to obtain meter with large cuff.  . Patient states that he will try to return current monitor and request [facility] special order a a monitor with correct cuff size o Encourage patient to start monitoring home BP twice daily once has monitor, keep log and will f/u regarding readings during upcoming telephone appointment . Will again mail patient BP monitoring log and written education on BP monitoring technique as requested (reports that wife accidentally discarded)  Patient Self Care Activities:  . Patient verbalizes understanding of plan to check BP twice daily and keep log . Calls provider office for new  concerns or questions  Please see past updates related to this goal by clicking on the "Past Updates" button in the selected goal         Patient verbalizes understanding of instructions provided today.   Telephone follow up appointment with care management team member scheduled for: 4/5  .Duanne Moron, PharmD, [facility] Clinical Pharmacist [facility] [employer] [phone]

## 2020-03-07 NOTE — Chronic Care Management (AMB) (Signed)
Care Management   Follow Up Note   03/07/2020 Name: Oscar Jones MRN: [id number] DOB: 1971-01-19  Referred by: Tarri Fuller, FNP Reason for referral : Chronic Care Management (Patient Phone Call)   Oscar Jones is a 49 y.o. year old male who is a primary care patient of Anitra Lauth, Jodelle Gross, FNP. The CCM team was consulted for assistance with chronic disease management and care coordination needs.    Receive voicemail from Oscar Jones requesting a call back regarding questions about BP monitoring.  I reached out to [employer] by phone today.   Review of patient status, including review of consultants reports, relevant laboratory and other test results, and collaboration with appropriate care team members and the patient's provider was performed as part of comprehensive patient evaluation and provision of chronic care management services.   .   Outpatient Encounter Medications as of 03/07/2020  Medication Sig  . amLODipine (NORVASC) 10 MG tablet TAKE 1 TABLET(10 MG) BY MOUTH DAILY  . atorvastatin (LIPITOR) 40 MG tablet TAKE 1 TABLET(40 MG) BY MOUTH DAILY AT 6 PM  . cloNIDine (CATAPRES) 0.1 MG tablet Take 1 tablet (0.1 mg total) by mouth 2 (two) times daily.  . halobetasol (ULTRAVATE) 0.05 % ointment Apply twice a day to affected areas  . hydrALAZINE (APRESOLINE) 100 MG tablet Take 100 mg by mouth every 8 (eight) hours.  . isosorbide mononitrate (IMDUR) 30 MG 24 hr tablet TAKE 1 TABLET(30 MG) BY MOUTH DAILY  . levothyroxine (SYNTHROID) 175 MCG tablet TAKE 1 TABLET(175 MCG) BY MOUTH DAILY BEFORE BREAKFAST  . olmesartan (BENICAR) 40 MG tablet TAKE 1 TABLET(40 MG) BY MOUTH DAILY  . Vitamin D, Cholecalciferol, 50 MCG (2000 UT) CAPS Take 1 capsule by mouth daily.   No facility-administered encounter medications on file as of 03/07/2020.    Goals Addressed            This Visit's Progress   . PharmD - Medication Management       CARE PLAN ENTRY (see longtitudinal plan of care  for additional care plan information)  Current Barriers:  . Chronic Disease Management support, education, and care coordination needs related to HTN, hypothyroidism, CKD, prediabetes, discoid lupus erythematosus and Vitamin D deficiency  . Lack of blood pressure results for clinical team  Pharmacist Clinical Goal(s):  Marland Kitchen Over the next 30 days, patient will work with CM Pharmacist and PCP to address needs related to HTN management and medication regimen optimization  Interventions: . Follow up call to Oscar Jones to address patient's questions regarding BP monitoring o Oscar Jones reports that he obtained a new upper arm BP monitor, but was unable to locate a monitor with large cuff size - Note from chart and patient reports requires large cuff size.  - Counsel on places for patient to obtain meter with large cuff.  . Patient states that he will try to return current monitor and request [facility] special order a a monitor with correct cuff size o Encourage patient to start monitoring home BP twice daily once has monitor, keep log and will f/u regarding readings during upcoming telephone appointment . Will again mail patient BP monitoring log and written education on BP monitoring technique as requested (reports that wife accidentally discarded)  Patient Self Care Activities:  . Patient verbalizes understanding of plan to check BP twice daily and keep log . Calls provider office for new concerns or questions  Please see past updates related to this goal by clicking on the "Past  Updates" button in the selected goal         Plan  Telephone follow up appointment with care management team member scheduled for: 4/5  Harlow Asa, PharmD, [place] [phone]

## 2020-03-08 ENCOUNTER — Telehealth: Payer: Self-pay

## 2020-03-12 ENCOUNTER — Telehealth: Payer: Self-pay

## 2020-03-12 ENCOUNTER — Ambulatory Visit: Payer: Self-pay | Admitting: Pharmacist

## 2020-03-12 NOTE — Chronic Care Management (AMB) (Signed)
  Care Management   Follow Up Note   03/12/2020 Name: Lamir Racca MRN: [id number] DOB: 05/03/1971  Referred by: Tarri Fuller, FNP Reason for referral : Chronic Care Management (Patient Phone Call)   Jaamal Farooqui is a 49 y.o. year old male who is a primary care patient of Anitra Lauth, Jodelle Gross, FNP. The care management team was consulted for assistance with care management and care coordination needs.    Was unable to reach patient via telephone today and have left HIPAA compliant voicemail asking patient to return my call.   Plan  The care management team will reach out to the patient again over the next 30 days.   Duanne Moron, PharmD, [facility] Clinical Pharmacist [facility] [employer] [phone]

## 2020-03-22 ENCOUNTER — Other Ambulatory Visit: Payer: Self-pay | Admitting: Family Medicine

## 2020-03-22 DIAGNOSIS — I10 Essential (primary) hypertension: Secondary | ICD-10-CM

## 2020-03-22 NOTE — Telephone Encounter (Signed)
Requested Prescriptions  Pending Prescriptions Disp Refills  . amLODipine (NORVASC) 10 MG tablet [Pharmacy Med Name: AMLODIPINE BESYLATE 10MG  TABLETS] 90 tablet 0    Sig: TAKE 1 TABLET(10 MG) BY MOUTH DAILY     Cardiovascular:  Calcium Channel Blockers Passed - 03/22/2020 10:18 AM      Passed - Last BP in normal range    BP Readings from Last 1 Encounters:  02/24/20 118/79         Passed - Valid encounter within last 6 months    Recent Outpatient Visits          3 weeks ago OSA (obstructive sleep apnea)   [facility], [facility], FNP   1 month ago Essential hypertension   [facility], [facility], FNP   4 months ago Essential hypertension   [facility], DO   7 months ago Mixed hyperlipidemia   [facility], Kyung Rudd, NP   1 year ago Other specified hypothyroidism   [facility], Kyung Rudd, NP      Future Appointments            In 2 months Malfi, Alison Stalling, FNP [facility], [facility]   In 4 months Malfi, [facility], FNP [facility], [facility]

## 2020-04-04 ENCOUNTER — Ambulatory Visit: Payer: Self-pay | Admitting: Pharmacist

## 2020-04-04 DIAGNOSIS — I10 Essential (primary) hypertension: Secondary | ICD-10-CM

## 2020-04-04 DIAGNOSIS — E559 Vitamin D deficiency, unspecified: Secondary | ICD-10-CM

## 2020-04-04 NOTE — Patient Instructions (Signed)
Thank you allowing the Chronic Care Management Team to be a part of your care! It was a pleasure speaking with you today!     CCM (Chronic Care Management) Team    Alto Denver RN, MSN, CCM Nurse Care Coordinator  [phone]   Duanne Moron PharmD  Clinical Pharmacist  [phone]   Dickie La LCSW Clinical Social Worker [phone]  Visit Information  Goals Addressed            This Visit's Progress   . PharmD - Medication Management       CARE PLAN ENTRY (see longtitudinal plan of care for additional care plan information)  Current Barriers:  . Chronic Disease Management support, education, and care coordination needs related to HTN, hypothyroidism, CKD, prediabetes, discoid lupus erythematosus and Vitamin D deficiency  . Lack of blood pressure results for clinical team  Pharmacist Clinical Goal(s):  Marland Kitchen Over the next 30 days, patient will work with CM Pharmacist and PCP to address needs related to HTN management and medication regimen optimization  Interventions:  . Counsel on importance of blood pressure control and monitoring o Unable to review BP medications right now; patient currently traveling for work o Denies having obtainined new BP monitor with large cuff o Reports when has recently checked BP on old monitor, running ~ 120-130s/90s . Encourage patient to obtain new BP monitor with large cuff to check BP as directed o Patient states that he will request [facility] special order a a monitor with correct cuff size . Follow up regarding Vitamin D supplementation o Per chart review, Vitamin D level on 3/1 was 13 ng/mL and patient advised by PCP to start OTC Vitamin D supplement o Patient confirms started taking Vitamin D (2,000 IU) OTC supplement daily as directed  Patient Self Care Activities:  . Patient verbalizes understanding of plan to check BP twice daily and keep log . Calls provider office for new concerns or questions  Please see past  updates related to this goal by clicking on the "Past Updates" button in the selected goal         Patient verbalizes understanding of instructions provided today.   Telephone follow up appointment with care management team member scheduled for: 5/12 at 2 pm  Duanne Moron, PharmD, [facility] Clinical Pharmacist [facility] [employer] [phone]

## 2020-04-04 NOTE — Chronic Care Management (AMB) (Signed)
Chronic Care Management   Follow Up Note   04/04/2020 Name: Liston Thum MRN: [id number] DOB: 1971/10/30  Referred by: Tarri Fuller, FNP Reason for referral : Chronic Care Management (Patient Phone Call)   Jakyri Brunkhorst is a 49 y.o. year old male who is a primary care patient of Anitra Lauth, Jodelle Gross, FNP. The CCM team was consulted for assistance with chronic disease management and care coordination needs.    I reached out to [employer] by phone today.   Review of patient status, including review of consultants reports, relevant laboratory and other test results, and collaboration with appropriate care team members and the patient's provider was performed as part of comprehensive patient evaluation and provision of chronic care management services.      Outpatient Encounter Medications as of 04/04/2020  Medication Sig  . Vitamin D, Cholecalciferol, 50 MCG (2000 UT) CAPS Take 1 capsule by mouth daily.  Marland Kitchen amLODipine (NORVASC) 10 MG tablet TAKE 1 TABLET(10 MG) BY MOUTH DAILY  . atorvastatin (LIPITOR) 40 MG tablet TAKE 1 TABLET(40 MG) BY MOUTH DAILY AT 6 PM  . cloNIDine (CATAPRES) 0.1 MG tablet Take 1 tablet (0.1 mg total) by mouth 2 (two) times daily.  . halobetasol (ULTRAVATE) 0.05 % ointment Apply twice a day to affected areas  . hydrALAZINE (APRESOLINE) 100 MG tablet Take 100 mg by mouth every 8 (eight) hours.  . isosorbide mononitrate (IMDUR) 30 MG 24 hr tablet TAKE 1 TABLET(30 MG) BY MOUTH DAILY  . levothyroxine (SYNTHROID) 175 MCG tablet TAKE 1 TABLET(175 MCG) BY MOUTH DAILY BEFORE BREAKFAST  . olmesartan (BENICAR) 40 MG tablet TAKE 1 TABLET(40 MG) BY MOUTH DAILY   No facility-administered encounter medications on file as of 04/04/2020.    Goals Addressed            This Visit's Progress   . PharmD - Medication Management       CARE PLAN ENTRY (see longtitudinal plan of care for additional care plan information)  Current Barriers:  . Chronic Disease Management  support, education, and care coordination needs related to HTN, hypothyroidism, CKD, prediabetes, discoid lupus erythematosus and Vitamin D deficiency  . Lack of blood pressure results for clinical team  Pharmacist Clinical Goal(s):  Marland Kitchen Over the next 30 days, patient will work with CM Pharmacist and PCP to address needs related to HTN management and medication regimen optimization  Interventions: . Patient reports that he has been going through a difficult time recently, separating from wife and changes at work . Counsel on importance of blood pressure control and monitoring o Unable to review BP medications right now; patient currently traveling for work o Denies having obtainined new BP monitor with large cuff o Reports when has recently checked BP on old monitor, running ~ 120-130s/90s . Encourage patient to obtain new BP monitor with large cuff to check BP as directed o Patient states that he will request [facility] special order a a monitor with correct cuff size . Follow up regarding Vitamin D supplementation o Per chart review, Vitamin D level on 3/1 was 13 ng/mL and patient advised by PCP to start OTC Vitamin D supplement o Patient confirms started taking Vitamin D (2,000 IU) OTC supplement daily as directed  Patient Self Care Activities:  . Patient verbalizes understanding of plan to check BP twice daily and keep log . Calls provider office for new concerns or questions  Please see past updates related to this goal by clicking on the "Past Updates" button in  the selected goal         Plan  Telephone follow up appointment with care management team member scheduled for: 5/12 at 2 pm  Harlow Asa, PharmD, [place] [phone]

## 2020-04-05 ENCOUNTER — Ambulatory Visit: Payer: Self-pay | Admitting: Licensed Clinical Social Worker

## 2020-04-05 NOTE — Chronic Care Management (AMB) (Signed)
  Care Management   Follow Up Note   04/05/2020 Name: Oscar Jones MRN: [id number] DOB: December 28, 1970  Referred by: Tarri Fuller, FNP Reason for referral : Care Coordination   Oscar Jones is a 49 y.o. year old male who is a primary care patient of Anitra Lauth, Jodelle Gross, FNP. The care management team was consulted for assistance with care management and care coordination needs.    Review of patient status, including review of consultants reports, relevant laboratory and other test results, and collaboration with appropriate care team members and the patient's provider was performed as part of comprehensive patient evaluation and provision of chronic care management services.    LCSW completed initial outreach and spoke with patient successfully. Patient reports having no current social work needs or goals but he wanted LCSW to pass on the message that he has decided to cancel his sleep study appointment that is in 3 weeks. He said that his insurance is not willing to cover this speciality appointment and he would have to pay $300 up front. He said that for this reason he is not wanting to pursue getting a CPAP. LCSW suggested patient could set up a payment plan but he would need to have a way for the payment to be drafted each month. PCP suggested patient going to a sleep medicine dentist. They usually are able to provide mouth guards to help with snoring and typically this is billed under medical insurance and not dental insurance. LCSW will send these suggested resources to patient's email. Patient denied needing any other community resource education or AA support group information at this time but was appreciative of education provided.   The care management team will continue to follow up with patient.   Dickie La, BSW, MSW, LCSW [facility] [place]  [facility] [email or web address] Phone: [phone]

## 2020-04-09 ENCOUNTER — Encounter: Payer: Self-pay | Admitting: Family Medicine

## 2020-04-12 ENCOUNTER — Telehealth: Payer: Self-pay

## 2020-04-12 ENCOUNTER — Ambulatory Visit: Payer: Self-pay | Admitting: General Practice

## 2020-04-12 NOTE — Chronic Care Management (AMB) (Signed)
°  Chronic Care Management   Outreach Note  04/12/2020 Name: Broadus Costilla MRN: [id number] DOB: 15-Aug-1971  Referred by: Tarri Fuller, FNP Reason for referral : Care Coordination (RNCM Chronic Disease Management and Care Coordination needs: HLD/HTN/CKD)   An unsuccessful telephone outreach was attempted today. The patient was referred to the case management team for assistance with care management and care coordination.   Follow Up Plan: A HIPPA compliant phone message was left for the patient providing contact information and requesting a return call.   Alto Denver RN, MSN, CCM Community Care Coordinator [place]   [facility] [place] Mobile: [phone]

## 2020-04-18 ENCOUNTER — Ambulatory Visit: Payer: Self-pay | Admitting: Pharmacist

## 2020-04-18 DIAGNOSIS — I10 Essential (primary) hypertension: Secondary | ICD-10-CM

## 2020-04-18 NOTE — Patient Instructions (Signed)
Thank you allowing the Chronic Care Management Team to be a part of your care! It was a pleasure speaking with you today!     CCM (Chronic Care Management) Team    Alto Denver RN, MSN, CCM Nurse Care Coordinator  [phone]   Duanne Moron PharmD  Clinical Pharmacist  [phone]   Dickie La LCSW Clinical Social Worker [phone]  Visit Information  Goals Addressed            This Visit's Progress   . PharmD - Medication Management       CARE PLAN ENTRY (see longtitudinal plan of care for additional care plan information)  Current Barriers:  . Chronic Disease Management support, education, and care coordination needs related to HTN, hypothyroidism, CKD, prediabetes, discoid lupus erythematosus and Vitamin D deficiency  . Lack of blood pressure results for clinical team  Pharmacist Clinical Goal(s):  Marland Kitchen Over the next 30 days, patient will work with CM Pharmacist and PCP to address needs related to HTN management and medication regimen optimization  Interventions: . Patient reports that he just recently returned home from Working in [place] . Counsel on importance of blood pressure control and monitoring o Unable to review BP medications right now; patient currently at work o Denies having obtainined new BP monitor with large cuff . Again encourage patient to obtain new BP monitor with large cuff to check BP as directed o Patient states that he will call today to request [facility] special order a a monitor with correct cuff size . Counsel patient on navigating health benefit . Reschedule appointment to review medications and BP readings at a time when patient will be home.  Patient Self Care Activities:  . Patient verbalizes understanding of plan to check BP twice daily and keep log . Calls provider office for new concerns or questions  Please see past updates related to this goal by clicking on the "Past Updates" button in the selected goal          Patient verbalizes understanding of instructions provided today.   Telephone follow up appointment with care management team member scheduled for: 6/2 at 1 pm  Duanne Moron, PharmD, [facility] Clinical Pharmacist [facility] [employer] [phone]

## 2020-04-18 NOTE — Chronic Care Management (AMB) (Signed)
  Care Management   Follow Up Note   04/18/2020 Name: Oscar Jones MRN: [id number] DOB: Jan 29, 1971  Referred by: Tarri Fuller, FNP Reason for referral : Chronic Care Management (Patient Phone Call)   Oscar Jones is a 49 y.o. year old male who is a primary care patient of Anitra Lauth, Jodelle Gross, FNP. The care management team was consulted for assistance with care management and care coordination needs.    Outreach to [employer] by phone today.  Review of patient status, including review of consultants reports, relevant laboratory and other test results, and collaboration with appropriate care team members and the patient's provider was performed as part of comprehensive patient evaluation and provision of chronic care management services.    SDOH (Social Determinants of Health) assessments performed: No See Care Plan activities for detailed interventions related to [facility])     Advanced Directives: See Care Plan and Vynca application for related entries.   Goals Addressed            This Visit's Progress   . PharmD - Medication Management       CARE PLAN ENTRY (see longtitudinal plan of care for additional care plan information)  Current Barriers:  . Chronic Disease Management support, education, and care coordination needs related to HTN, hypothyroidism, CKD, prediabetes, discoid lupus erythematosus and Vitamin D deficiency  . Lack of blood pressure results for clinical team  Pharmacist Clinical Goal(s):  Marland Kitchen Over the next 30 days, patient will work with CM Pharmacist and PCP to address needs related to HTN management and medication regimen optimization  Interventions: . Patient reports that he just recently returned home from Working in [place] . Counsel on importance of blood pressure control and monitoring o Unable to review BP medications right now; patient currently at work o Denies having obtainined new BP monitor with large cuff . Again encourage patient to  obtain new BP monitor with large cuff to check BP as directed o Patient states that he will call today to request [facility] special order a a monitor with correct cuff size . Counsel patient on navigating health benefit . Reschedule appointment to review medications and BP readings at a time when patient will be home.  Patient Self Care Activities:  . Patient verbalizes understanding of plan to check BP twice daily and keep log . Calls provider office for new concerns or questions  Please see past updates related to this goal by clicking on the "Past Updates" button in the selected goal         Plan  Telephone follow up appointment with care management team member scheduled for: 6/2 at 1 pm  Duanne Moron, PharmD, [facility] Clinical Pharmacist [facility] Care Management [phone]

## 2020-04-19 ENCOUNTER — Encounter: Payer: Self-pay | Admitting: Family Medicine

## 2020-04-20 ENCOUNTER — Telehealth: Payer: Self-pay | Admitting: Family Medicine

## 2020-04-20 NOTE — Telephone Encounter (Signed)
Already sent patient a chart message this morning.  Was seen Mid March 2021 and was to follow up in 2 weeks but has not kept appointment.  Will discuss any concerns during an in office visit.

## 2020-04-20 NOTE — Telephone Encounter (Signed)
Iliud, nurse case manager with [facility], calling to request Danielle Rankin, FNP consider a fasting insulin test rather than fasting glucose test if deemed appropriate. Iliud states that patient's blood pressure has been fluctuating and would like to find out if there may be underlying cause (see mychart message from 5/13, unable to addend)

## 2020-04-23 ENCOUNTER — Telehealth: Payer: Self-pay

## 2020-04-23 ENCOUNTER — Ambulatory Visit: Payer: Self-pay | Admitting: General Practice

## 2020-04-23 NOTE — Chronic Care Management (AMB) (Signed)
°  Chronic Care Management   Outreach Note  04/23/2020 Name: Oscar Jones MRN: [id number] DOB: [birth year]  Referred by: Tarri Fuller, FNP Reason for referral : Care Coordination (follow up- 2nd attempt: HTN/HLD/CKD- new concerns)   A second unsuccessful telephone outreach was attempted today. The patient was referred to the case management team for assistance with care management and care coordination.   Follow Up Plan: A HIPPA compliant phone message was left for the patient providing contact information and requesting a return call.   Alto Denver RN, MSN, CCM Community Care Coordinator [place]   [facility] [place] Mobile: [phone]

## 2020-04-24 ENCOUNTER — Telehealth: Payer: Self-pay

## 2020-04-24 NOTE — Telephone Encounter (Signed)
Copied from CRM [id number]. Topic: General - Inquiry >> Apr 24, 2020  9:00 AM Oscar Jones, NT wrote: Reason for CRM: Patient called in stating he would like for a nurse to call to go over previous a1c check, to discuss with next doctor. Please advise.   I called and spoke with the patient and he was requesting labs for numbness in his feet. I recommended the patient to schedule an office visit to address his concerns.

## 2020-04-24 NOTE — Telephone Encounter (Signed)
Pt called back and wants to know if the dr did a fasting glucose test at last visit?  Pt states he is urinating all night long, his feet hurt, and his toes get numb.  His insurance case manager called last week to ask the dr to orders fasting glucose labs. But pt has not heard anything back. Pt has appt 6/25, but wants to have labs done prior to visit. Pt states insurance is concerned pt may be miss diagnosed and he may be a diabetic.  Pt would like a call back to discuss.

## 2020-05-08 ENCOUNTER — Telehealth: Payer: Self-pay

## 2020-05-08 ENCOUNTER — Encounter: Payer: Self-pay | Admitting: Family Medicine

## 2020-05-08 NOTE — Telephone Encounter (Signed)
We need to get his blood pressure under control before we can proceed with a medication such as viagra or cialis.  Can we get him scheduled for an in office visit where we can review his concerns for ED?

## 2020-05-08 NOTE — Telephone Encounter (Signed)
Can you triage

## 2020-05-08 NOTE — Telephone Encounter (Signed)
Any update on his concerns?  Can we have him scheduled for an in office visit?  He was to have returned 2 weeks after his last visit for hypertension follow up and to discuss the referrals that have been placed.  Thanks

## 2020-05-08 NOTE — Telephone Encounter (Signed)
I called and spoke with the patient and he is requesting that you call in a prescription for Viagra. He state that he have not been able to have intercourse with his wife in [place], because of his ED that he contributes to the medication he is taking.

## 2020-05-08 NOTE — Telephone Encounter (Signed)
Copied from CRM [id number]. Topic: General - Other >> May 08, 2020  8:19 AM Lyn Hollingshead D wrote: Reason for CRM: PT wants a call back from [facility] nurse, he has a personal question to ask

## 2020-05-09 ENCOUNTER — Ambulatory Visit: Payer: Self-pay | Admitting: Pharmacist

## 2020-05-09 DIAGNOSIS — I10 Essential (primary) hypertension: Secondary | ICD-10-CM

## 2020-05-09 NOTE — Telephone Encounter (Signed)
The pt was notified of the provider recommendations. He verbalize understanding, no questions or concerns. He decline scheduling an appt because he states that he is currently out of town.

## 2020-05-09 NOTE — Chronic Care Management (AMB) (Signed)
  Care Management   Follow Up Note   05/09/2020 Name: Oscar Jones MRN: [id number] DOB: 1971-07-12  Referred by: Tarri Fuller, FNP Reason for referral : Chronic Care Management (Patient Phone Call)   Oscar Jones is a 49 y.o. year old male who is a primary care patient of Anitra Lauth, Jodelle Gross, FNP. The care management team was consulted for assistance with care management and care coordination needs.    Review of patient status, including review of consultants reports, relevant laboratory and other test results, and collaboration with appropriate care team members and the patient's provider was performed as part of comprehensive patient evaluation and provision of chronic care management services.    SDOH (Social Determinants of Health) assessments performed: No See Care Plan activities for detailed interventions related to [facility])     Advanced Directives: See Care Plan and Vynca application for related entries.   Goals Addressed            This Visit's Progress   . PharmD - Medication Management       CARE PLAN ENTRY (see longtitudinal plan of care for additional care plan information)  Current Barriers:  . Chronic Disease Management support, education, and care coordination needs related to HTN, hypothyroidism, CKD, prediabetes, discoid lupus erythematosus and Vitamin D deficiency  . Lack of blood pressure results for clinical team  Pharmacist Clinical Goal(s):  Marland Kitchen Over the next 30 days, patient will work with CM Pharmacist and PCP to address needs related to HTN management and medication regimen optimization  Interventions: . Patient reports that he out of town again, working in [place] . Counsel on importance of blood pressure control and monitoring o Unable to review BP medications right now; patient currently at work o Reports obtained new BP monitor with large cuff . Again encourage patient to check BP and keep log of results . Advise patient against taking  prescription medications that are not prescribed to him o Reports has been taking wife's Flexeril for foot pain. Encourage patient to follow up with PCP regarding foot pain  Patient notes currently has next appointment with PCP scheduled for 6/25. States that he will call the clinic to see if he can be seen sooner. Remus Loffler on importance of medication adherence . Reschedule appointment to review medications and BP readings at a time when patient will be home.  Patient Self Care Activities:  . Patient verbalizes understanding of plan to check BP twice daily and keep log . Calls provider office for new concerns or questions  Please see past updates related to this goal by clicking on the "Past Updates" button in the selected goal         Plan  Telephone follow up appointment with care management team member scheduled for: 6/23 at 1 pm  Duanne Moron, PharmD, [facility] Clinical Pharmacist [facility] Care Management [phone]

## 2020-05-09 NOTE — Telephone Encounter (Signed)
Please see previous message

## 2020-05-09 NOTE — Patient Instructions (Signed)
Thank you allowing the Care Management Team to be a part of your care! It was a pleasure speaking with you today!     Care Management Team    Alto Denver RN, MSN, CCM Nurse Care Coordinator  [phone]   Duanne Moron PharmD  Clinical Pharmacist  [phone]   Dickie La LCSW Clinical Social Worker [phone]   Visit Information  Goals Addressed            This Visit's Progress   . PharmD - Medication Management       CARE PLAN ENTRY (see longtitudinal plan of care for additional care plan information)  Current Barriers:  . Chronic Disease Management support, education, and care coordination needs related to HTN, hypothyroidism, CKD, prediabetes, discoid lupus erythematosus and Vitamin D deficiency  . Lack of blood pressure results for clinical team  Pharmacist Clinical Goal(s):  Marland Kitchen Over the next 30 days, patient will work with CM Pharmacist and PCP to address needs related to HTN management and medication regimen optimization  Interventions: . Patient reports that he out of town again, working in [place] . Counsel on importance of blood pressure control and monitoring o Unable to review BP medications right now; patient currently at work o Reports obtained new BP monitor with large cuff . Again encourage patient to check BP and keep log of results . Advise patient against taking prescription medications that are not prescribed to him o Reports has been taking wife's Flexeril for foot pain. Encourage patient to follow up with PCP regarding foot pain  Patient notes currently has next appointment with PCP scheduled for 6/25. States that he will call the clinic to see if he can be seen sooner. Remus Loffler on importance of medication adherence . Reschedule appointment to review medications and BP readings at a time when patient will be home.  Patient Self Care Activities:  . Patient verbalizes understanding of plan to check BP twice daily and keep  log . Calls provider office for new concerns or questions  Please see past updates related to this goal by clicking on the "Past Updates" button in the selected goal         Patient verbalizes understanding of instructions provided today.   Telephone follow up appointment with care management team member scheduled for: 6/23 at 1 pm  Duanne Moron, PharmD, [facility] Clinical Pharmacist [facility] Care Management [phone]

## 2020-05-21 ENCOUNTER — Other Ambulatory Visit: Payer: Self-pay | Admitting: Family Medicine

## 2020-05-21 DIAGNOSIS — E890 Postprocedural hypothyroidism: Secondary | ICD-10-CM

## 2020-05-23 ENCOUNTER — Encounter: Payer: Self-pay | Admitting: Family Medicine

## 2020-05-23 ENCOUNTER — Other Ambulatory Visit: Payer: Self-pay | Admitting: Family Medicine

## 2020-05-23 DIAGNOSIS — L732 Hidradenitis suppurativa: Secondary | ICD-10-CM

## 2020-05-23 MED ORDER — HALOBETASOL PROPIONATE 0.05 % EX OINT: TOPICAL_OINTMENT | CUTANEOUS | 0 refills | Status: DC

## 2020-05-24 ENCOUNTER — Encounter: Payer: Self-pay | Admitting: Family Medicine

## 2020-05-26 ENCOUNTER — Other Ambulatory Visit: Payer: Self-pay | Admitting: Family Medicine

## 2020-05-26 DIAGNOSIS — I10 Essential (primary) hypertension: Secondary | ICD-10-CM

## 2020-05-26 NOTE — Telephone Encounter (Signed)
Requested Prescriptions  Pending Prescriptions Disp Refills  . isosorbide mononitrate (IMDUR) 30 MG 24 hr tablet [Pharmacy Med Name: ISOSORBIDE MONONITRATE 30MG  ER TABS] 90 tablet 0    Sig: TAKE 1 TABLET(30 MG) BY MOUTH DAILY     Cardiovascular:  Nitrates Passed - 05/26/2020  9:14 AM      Passed - Last BP in normal range    BP Readings from Last 1 Encounters:  02/24/20 118/79         Passed - Last Heart Rate in normal range    Pulse Readings from Last 1 Encounters:  02/24/20 72         Passed - Valid encounter within last 12 months    Recent Outpatient Visits          3 months ago OSA (obstructive sleep apnea)   [facility], [facility], FNP   3 months ago Essential hypertension   [facility], [facility], FNP   6 months ago Essential hypertension   [facility], DO   9 months ago Mixed hyperlipidemia   [facility], Kyung Rudd, NP   1 year ago Other specified hypothyroidism   [facility], Kyung Rudd, NP      Future Appointments            In 6 days Malfi, Alison Stalling, FNP [facility], [facility]   In 2 months Malfi, [facility], FNP [facility], [facility]

## 2020-05-29 ENCOUNTER — Telehealth: Payer: Self-pay | Admitting: Family Medicine

## 2020-05-29 NOTE — Telephone Encounter (Signed)
Copied from CRM [id number]. Topic: General - Other >> May 29, 2020  8:38 AM Oscar Jones wrote: Reason for CRM: Pt has a Pharmacy appt for his meds on wed at 1pm/ Pt has to do an inspeaction to close on a home and would like to have her change the appt to Friday if possible around the time of his appt with Joni Reining or if she can do a phone appt with him on Wednesday morning/ please give Pt a call today to advise if change can be made to that appt

## 2020-05-30 ENCOUNTER — Telehealth: Payer: Self-pay

## 2020-05-30 ENCOUNTER — Telehealth: Payer: Self-pay | Admitting: Family Medicine

## 2020-05-30 NOTE — Chronic Care Management (AMB) (Signed)
  Care Management   Note  05/30/2020 Name: Oscar Jones MRN: [id number] DOB: 30-Nov-1971  Oscar Jones is a 49 y.o. year old male who is a primary care patient of Anitra Lauth, Jodelle Gross, FNP and is actively engaged with the care management team. I reached out to Gwenevere Abbot by phone today to assist with re-scheduling an initial visit with the Pharmacist  Follow up plan: Telephone appointment with care management team member scheduled for:06/27/2020  Penne Lash, RMA Care Guide, Embedded Care Coordination [facility]  [place], [place] [address] Direct Dial: [phone] [email or web address] Website: [email or web address]

## 2020-05-30 NOTE — Telephone Encounter (Signed)
Pt has been r/s for 06/27/2020

## 2020-06-01 ENCOUNTER — Encounter: Payer: Self-pay | Admitting: Family Medicine

## 2020-06-01 ENCOUNTER — Ambulatory Visit: Payer: 59 | Admitting: Family Medicine

## 2020-06-01 ENCOUNTER — Other Ambulatory Visit: Payer: Self-pay

## 2020-06-01 VITALS — BP 110/80 | HR 66 | Temp 97.1°F | Ht 72.0 in | Wt 308.8 lb

## 2020-06-01 DIAGNOSIS — L732 Hidradenitis suppurativa: Secondary | ICD-10-CM

## 2020-06-01 DIAGNOSIS — E890 Postprocedural hypothyroidism: Secondary | ICD-10-CM

## 2020-06-01 DIAGNOSIS — E782 Mixed hyperlipidemia: Secondary | ICD-10-CM

## 2020-06-01 DIAGNOSIS — I10 Essential (primary) hypertension: Secondary | ICD-10-CM

## 2020-06-01 DIAGNOSIS — N529 Male erectile dysfunction, unspecified: Secondary | ICD-10-CM

## 2020-06-01 DIAGNOSIS — G8929 Other chronic pain: Secondary | ICD-10-CM

## 2020-06-01 DIAGNOSIS — M79671 Pain in right foot: Secondary | ICD-10-CM

## 2020-06-01 MED ORDER — OLMESARTAN MEDOXOMIL 40 MG PO TABS: ORAL_TABLET | ORAL | 1 refills | Status: DC

## 2020-06-01 MED ORDER — DOXYCYCLINE HYCLATE 100 MG PO TABS: 100.0000 mg | ORAL_TABLET | Freq: Two times a day (BID) | ORAL | 1 refills | Status: DC

## 2020-06-01 MED ORDER — TADALAFIL 10 MG PO TABS: 10.0000 mg | ORAL_TABLET | Freq: Every day | ORAL | 3 refills | Status: DC | PRN

## 2020-06-01 MED ORDER — GABAPENTIN 100 MG PO CAPS: 100.0000 mg | ORAL_CAPSULE | Freq: Three times a day (TID) | ORAL | 3 refills | Status: DC

## 2020-06-01 MED ORDER — LEVOTHYROXINE SODIUM 175 MCG PO TABS: ORAL_TABLET | ORAL | 0 refills | Status: DC

## 2020-06-01 MED ORDER — HYDRALAZINE HCL 100 MG PO TABS: 100.0000 mg | ORAL_TABLET | Freq: Three times a day (TID) | ORAL | 3 refills | Status: DC

## 2020-06-01 MED ORDER — ATORVASTATIN CALCIUM 40 MG PO TABS: ORAL_TABLET | ORAL | 1 refills | Status: DC

## 2020-06-01 NOTE — Assessment & Plan Note (Signed)
Possible over controlled hypertension.  BP is at goal < 130/80.  Pt is working on lifestyle modifications.  Taking medications with concerns of dizziness and fatigue 1 hour after taking clonidine each morning.  Does not have a BP monitor at home, but will be getting one to check BP daily.  Reports nephrology has discontinued his amlodipine at this time.  Complications: Obesity, hyperlipidemia, lupus, chronic kidney disease, and untreated OSA.  Plan: 1. Continue taking carvedilol 6.25mg  twice daily, hydralazine 100mg  TID and olmesartan 40mg  daily.  HOLD Clonidine 0.1mg  twice daily and take if SBP > 160.  Educated on this in depth during appointment. 2. Obtain labs before next follow up visit in 3 months 3. Encouraged heart healthy diet and increasing exercise to 30 minutes most days of the week, going no more than 2 days in a row without exercise. 4. Check BP 1-2 x per week at home, keep log, and bring to clinic at next appointment. 5. Follow up 3 months.

## 2020-06-01 NOTE — Assessment & Plan Note (Signed)
Reports continued right foot pain, with some left foot pain.  Has had orthotics and has met with orthopedics and podiatry.  Reports no relief with current orthotics.  Has been given exercises to work on stretching his feet, but has not been doing them to date.  Reviewed exercises to stretch arch with stepping on step, and doing calf raises.  Reviewed can go to running store, such as [employer] and be fitted for orthotics that he can wear in any shoe to see if this would help.  Discussed can try gabapentin 14m TID prn for foot pain.  Patient agreeable to these options.  Plan: 1. Work on home exercises to stretch arch of foot daily 2. Look into new orthotic arches, can be fitted at local running store 3. Can stretch arches of foot with using a frozen water bottle to help reduce inflammation while stretching 4. Begin gabapentin 1053mTID PRN for foot pain 5. Follow up in 3 months

## 2020-06-01 NOTE — Progress Notes (Signed)
Subjective:    Patient ID: Oscar Jones, male    DOB: 10-14-1971, 49 y.o.   MRN: [id number]  Oscar Jones is a 49 y.o. male presenting on 06/01/2020 for Hypertension (pt state he saw his Nephrologist on last week and was told he needed to stop Amlodipine. ), Rectal Pain (abscess on the Right buttuck x 8 days. Pt requesting that you repack the boil today. ), Erectile Dysfunction, and Foot Pain   HPI  [name] presents to clinic for hypertension follow up visit, foot pain, right buttock abscess and erectile dysfunction.    Reports he has recently been to nephrology and they have instructed him to stop the amlodipine due to good control of his blood pressure.  Has not yet stopped the amlodipine, states that he has a few more days of prescription medication left in that bottle and would like to finish it out completely.  Has concerns for taking his medications in the morning and having dizziness within an hour, with fatigue, that makes his body feel heavy.  Has not yet obtained a blood pressure cuff to take home blood pressures.  Reviewed his medications and when he takes them, reports takes the levothyroxine and clonidine at 6:30am and by 7:30am he has symptom onset.  Hypertension - He is not checking BP at home or outside of clinic.    - Current medications: amlodipine 103m daily, carvedilol 6.260mtwice daily, clonidine 0.37m19mwice daily, hydralazine 100m36mery 8 hours and olmesartan 40mg80mly, tolerating with side effects dizziness and feeling exhausted 1 hour after taking clonidine daily - He is symptomatic with dizziness and feeling exhausted 1 hour after taking clonidine. - Pt denies headache, lightheadedness, changes in vision, chest tightness/pressure, palpitations, leg swelling, sudden loss of speech or loss of consciousness. - He  reports no regular exercise routine. - His diet is moderate in salt, moderate in fat, and moderate in carbohydrates.  Patient has met with Nephrology  and a referral to urology was placed for his concerns of erectile dysfunction.  Has reported relief of symptoms with cialis and sildenafil in the past.  Requesting refill of cialis.    Has concerns of right foot pain primarily with some left foot pain.  Has met with podiatry and orthopedics and had ordered orthotics for his shoes, reports no relief with this.  Had been told to perform home exercises for stretching his arches, reports is unsure he was doing them correctly.    Had an abscess on his right buttocks that was l[facility] with [facility] 05/29/[facility].  Reports this has happened a few times in the past on this right buttocks secondary to his hidradenitis suppurativa and is requesting referral to general surgery to have his area removed so this does not occur again.  Depression screen [facility] 02/03/2020 12/02/2018  Decreased Interest 0 0 0  Down, Depressed, Hopeless 0 0 0  PHQ - 2 Score 0 0 0    Social History   Tobacco Use  . Smoking status: Current Every Day Smoker    Packs/day: 0.50    Types: Cigarettes  . Smokeless tobacco: Never Used  Vaping Use  . Vaping Use: Never used  Substance Use Topics  . Alcohol use: Yes    Alcohol/week: 2.0 standard drinks    Types: 2 Cans of beer per week    Comment: 2 x 32 oz cans weekend, pint vodka every 3 weeks  . Drug use: No    Review of Systems  Constitutional: Positive for fatigue. Negative for activity change, appetite change, chills, diaphoresis, fever and unexpected weight change.  HENT: Negative.   Eyes: Negative.   Respiratory: Negative.   Cardiovascular: Negative.   Gastrointestinal: Negative.   Endocrine: Negative.   Genitourinary: Negative.        Erectile dysfunction  Musculoskeletal: Positive for arthralgias. Negative for back pain, gait problem, joint swelling, myalgias, neck pain and neck stiffness.  Skin: Positive for wound. Negative for color change, pallor and rash.  Allergic/Immunologic: Negative.     Neurological: Positive for dizziness. Negative for tremors, seizures, syncope, facial asymmetry, speech difficulty, weakness, light-headedness, numbness and headaches.  Hematological: Negative.   Psychiatric/Behavioral: Negative.    Per HPI unless specifically indicated above     Objective:    BP 110/80 (BP Location: Right Arm, Patient Position: Sitting, Cuff Size: Large)   Pulse 66   Temp (!) 97.1 F (36.2 C) (Temporal)   Ht 6' (1.829 m)   Wt (!) 308 lb 12.8 oz (140.1 kg)   BMI 41.88 kg/m   Wt Readings from Last 3 Encounters:  06/01/20 (!) 308 lb 12.8 oz (140.1 kg)  02/24/20 (!) 307 lb 6.4 oz (139.4 kg)  02/03/20 (!) 311 lb 9.6 oz (141.3 kg)    Physical Exam Vitals reviewed.  Constitutional:      General: He is not in acute distress.    Appearance: Normal appearance. He is well-developed and well-groomed. He is obese. He is not ill-appearing or toxic-appearing.  HENT:     Head: Normocephalic and atraumatic.     Nose:     Comments: Facemask is in place, covering mouth and nose. Eyes:     General: Lids are normal. Vision grossly intact.        Right eye: No discharge.        Left eye: No discharge.     Extraocular Movements: Extraocular movements intact.     Conjunctiva/sclera: Conjunctivae normal.     Pupils: Pupils are equal, round, and reactive to light.  Cardiovascular:     Rate and Rhythm: Normal rate and regular rhythm.     Pulses: Normal pulses.          Dorsalis pedis pulses are 2+ on the right side and 2+ on the left side.     Heart sounds: Normal heart sounds. No murmur heard.  No friction rub. No gallop.   Pulmonary:     Effort: Pulmonary effort is normal. No respiratory distress.     Breath sounds: Normal breath sounds.  Genitourinary:    Comments: deferred Musculoskeletal:        General: No tenderness. Normal range of motion.     Right lower leg: No edema.     Left lower leg: No edema.  Feet:     Right foot:     Skin integrity: Skin integrity  normal.     Left foot:     Skin integrity: Skin integrity normal.  Skin:    General: Skin is warm and dry.     Capillary Refill: Capillary refill takes less than 2 seconds.     Findings: Abscess present.     Comments: Resolving abscess right buttocks.  Packing in place from 05/29/2020 ER visit, packing removed in clinic.  No continued drainage, inside abscess pink with good granulation.  Placed gauze and large bandage over area until patient can return home.   Multiple areas of non-inflamed hidradenitis suppurativa on upper/mid back  Neurological:     General: No   focal deficit present.     Mental Status: He is alert and oriented to person, place, and time.     Cranial Nerves: Cranial nerves are intact.     Sensory: Sensation is intact.     Motor: Motor function is intact.     Coordination: Coordination is intact.     Gait: Gait is intact.     Deep Tendon Reflexes: Reflexes are normal and symmetric.  Psychiatric:        Attention and Perception: Attention and perception normal.        Mood and Affect: Mood and affect normal.        Speech: Speech normal.        Behavior: Behavior normal. Behavior is cooperative.        Thought Content: Thought content normal.        Cognition and Memory: Cognition and memory normal.    Results for orders placed or performed in visit on 02/03/20  CBC with Differential  Result Value Ref Range   WBC 5.1 3.8 - 10.8 Thousand/uL   RBC 4.89 4.20 - 5.80 Million/uL   Hemoglobin 15.3 13.2 - 17.1 g/dL   HCT 45.3 38 - 50 %   MCV 92.6 80.0 - 100.0 fL   MCH 31.3 27.0 - 33.0 pg   MCHC 33.8 32.0 - 36.0 g/dL   RDW 15.2 (H) 11.0 - 15.0 %   Platelets 172 140 - 400 Thousand/uL   MPV 11.3 7.5 - 12.5 fL   Neutro Abs 2,239 1,500 - 7,800 cells/uL   Lymphs Abs 2,015 850 - 3,900 cells/uL   Absolute Monocytes 663 200 - 950 cells/uL   Eosinophils Absolute 143 15 - 500 cells/uL   Basophils Absolute 41 0 - 200 cells/uL   Neutrophils Relative % 43.9 %   Total  Lymphocyte 39.5 %   Monocytes Relative 13.0 %   Eosinophils Relative 2.8 %   Basophils Relative 0.8 %  COMPLETE METABOLIC PANEL WITH GFR  Result Value Ref Range   Glucose, Bld 96 65 - 99 mg/dL   BUN 20 7 - 25 mg/dL   Creat 1.29 0.70 - 1.33 mg/dL   GFR, Est Non African American 64 > OR = 60 mL/min/1.73m2   GFR, Est African American 74 > OR = 60 mL/min/1.73m2   BUN/Creatinine Ratio NOT APPLICABLE 6 - 22 (calc)   Sodium 139 135 - 146 mmol/L   Potassium 4.3 3.5 - 5.3 mmol/L   Chloride 108 98 - 110 mmol/L   CO2 23 20 - 32 mmol/L   Calcium 9.4 8.6 - 10.3 mg/dL   Total Protein 7.1 6.1 - 8.1 g/dL   Albumin 3.9 3.6 - 5.1 g/dL   Globulin 3.2 1.9 - 3.7 g/dL (calc)   AG Ratio 1.2 1.0 - 2.5 (calc)   Total Bilirubin 0.4 0.2 - 1.2 mg/dL   Alkaline phosphatase (APISO) 63 35 - 144 U/L   AST 17 10 - 35 U/L   ALT 19 9 - 46 U/L  Lipid Profile  Result Value Ref Range   Cholesterol 174 <200 mg/dL   HDL 58 > OR = 40 mg/dL   Triglycerides 83 <150 mg/dL   LDL Cholesterol (Calc) 99 mg/dL (calc)   Total CHOL/HDL Ratio 3.0 <5.0 (calc)   Non-HDL Cholesterol (Calc) 116 <130 mg/dL (calc)  Thyroid Panel With TSH  Result Value Ref Range   T3 Uptake 30 22 - 35 %   T4, Total 12.4 (H) 4.9 - 10.5 mcg/dL   Free   Thyroxine Index 3.7 1.4 - 3.8   TSH 0.48 0.40 - 4.50 mIU/L  Vitamin D (25 hydroxy)  Result Value Ref Range   Vit D, 25-Hydroxy 13 (L) 30 - 100 ng/mL  PSA  Result Value Ref Range   PSA 0.5 < OR = 4.0 ng/mL  HgB A1c  Result Value Ref Range   Hgb A1c MFr Bld 6.1 (H) <5.7 % of total Hgb   Mean Plasma Glucose 128 (calc)   eAG (mmol/L) 7.1 (calc)      Assessment & Plan:   Problem List Items Addressed This Visit      Cardiovascular and Mediastinum   Accelerated hypertension   Relevant Medications   carvedilol (COREG) 6.25 MG tablet   atorvastatin (LIPITOR) 40 MG tablet   hydrALAZINE (APRESOLINE) 100 MG tablet   olmesartan (BENICAR) 40 MG tablet   Essential hypertension    Possible over  controlled hypertension.  BP is at goal < 130/80.  Pt is working on lifestyle modifications.  Taking medications with concerns of dizziness and fatigue 1 hour after taking clonidine each morning.  Does not have a BP monitor at home, but will be getting one to check BP daily.  Reports nephrology has discontinued his amlodipine at this time.  Complications: Obesity, hyperlipidemia, lupus, chronic kidney disease, and untreated OSA.  Plan: 1. Continue taking carvedilol 6.42m twice daily, hydralazine 1056mTID and olmesartan 4029maily.  HOLD Clonidine 0.1mg62mice daily and take if SBP > 160.  Educated on this in depth during appointment. 2. Obtain labs before next follow up visit in 3 months 3. Encouraged heart healthy diet and increasing exercise to 30 minutes most days of the week, going no more than 2 days in a row without exercise. 4. Check BP 1-2 x per week at home, keep log, and bring to clinic at next appointment. 5. Follow up 3 months.         Relevant Medications   carvedilol (COREG) 6.25 MG tablet   atorvastatin (LIPITOR) 40 MG tablet   hydrALAZINE (APRESOLINE) 100 MG tablet   olmesartan (BENICAR) 40 MG tablet     Musculoskeletal and Integument   Hidradenitis suppurativa    Hidradenitis suppurativa, has followed with dermatology, has been using halobetasol 0.05% ointment as needed and has some flaring with recent right buttocks abscess [facility]  Reports this has been a recurrent area for abscess formation, requiring I&D.  Requesting referral to general surgery to have this excised so they do not reoccur.  Plan: 1. Continue halobetasol 0.05% as directed 2. Begin Doxycycline 100mg14m daily 3. Referral to general surgery, per patient request, to have this area of right buttocks excised 4. Follow up in 3 months, if having worsening of symptoms to return to dermatology      Relevant Medications   sulfamethoxazole-trimethoprim  (BACTRIM DS) 800-160 MG tablet   doxycycline (VIBRA-TABS) 100 MG tablet   Other Relevant Orders   Ambulatory referral to General Surgery     Other   HLD (hyperlipidemia)   Relevant Medications   carvedilol (COREG) 6.25 MG tablet   atorvastatin (LIPITOR) 40 MG tablet   hydrALAZINE (APRESOLINE) 100 MG tablet   olmesartan (BENICAR) 40 MG tablet   Chronic foot pain, right - Primary    Reports continued right foot pain, with some left foot pain.  Has had orthotics and has met with orthopedics and podiatry.  Reports no relief with current orthotics.  Has been  given exercises to work on stretching his feet, but has not been doing them to date.  Reviewed exercises to stretch arch with stepping on step, and doing calf raises.  Reviewed can go to running store, such as [employer] and be fitted for orthotics that he can wear in any shoe to see if this would help.  Discussed can try gabapentin 100mg TID prn for foot pain.  Patient agreeable to these options.  Plan: 1. Work on home exercises to stretch arch of foot daily 2. Look into new orthotic arches, can be fitted at local running store 3. Can stretch arches of foot with using a frozen water bottle to help reduce inflammation while stretching 4. Begin gabapentin 100mg TID PRN for foot pain 5. Follow up in 3 months      Relevant Medications   acetaminophen (TYLENOL) 650 MG CR tablet   gabapentin (NEURONTIN) 100 MG capsule   Erectile dysfunction    Discussed nephrology has sent referral for patient to urology for concerns of erectile dysfunction.  Has received message but has not yet called them to schedule.  Offered a trial of cialis 10mg to take as needed for symptoms and if no improvement, to follow up with urology.  Patient in agreement with treatment plan.  Plan: 1. Begin cialis 10mg as needed, but can try 10mg every other day, for help with symptoms of erectile dysfunction 2. Follow up in 3 months, or sooner, should not achieve symptom  improvement       Other Visit Diagnoses    Postablative hypothyroidism       Relevant Medications   carvedilol (COREG) 6.25 MG tablet   levothyroxine (SYNTHROID) 175 MCG tablet      Meds ordered this encounter  Medications  . gabapentin (NEURONTIN) 100 MG capsule    Sig: Take 1 capsule (100 mg total) by mouth 3 (three) times daily.    Dispense:  90 capsule    Refill:  3  . atorvastatin (LIPITOR) 40 MG tablet    Sig: TAKE 1 TABLET(40 MG) BY MOUTH DAILY AT 6 PM    Dispense:  90 tablet    Refill:  1  . hydrALAZINE (APRESOLINE) 100 MG tablet    Sig: Take 1 tablet (100 mg total) by mouth every 8 (eight) hours.    Dispense:  240 tablet    Refill:  3  . levothyroxine (SYNTHROID) 175 MCG tablet    Sig: TAKE 1 TABLET(175 MCG) BY MOUTH DAILY BEFORE BREAKFAST    Dispense:  90 tablet    Refill:  0  . olmesartan (BENICAR) 40 MG tablet    Sig: TAKE 1 TABLET(40 MG) BY MOUTH DAILY    Dispense:  90 tablet    Refill:  1  . DISCONTD: tadalafil (CIALIS) 10 MG tablet    Sig: Take 1 tablet (10 mg total) by mouth daily as needed for erectile dysfunction.    Dispense:  15 tablet    Refill:  3  . doxycycline (VIBRA-TABS) 100 MG tablet    Sig: Take 1 tablet (100 mg total) by mouth 2 (two) times daily.    Dispense:  180 tablet    Refill:  1      Follow up plan: Return in about 3 months (around 09/01/2020) for HTN F/U.   Nicole Marie Malfi, FNP Family Nurse Practitioner [facility] 06/01/2020, 10:15 AM  

## 2020-06-01 NOTE — Patient Instructions (Addendum)
As we discussed, hold the clonidine dosing unless you have a blood pressure that the systolic number is greater than 160.  This is more than likely the reason that you are having symptomatic hypotension (low blood pressure) every morning.  Take your blood pressure daily and record in a log book.  A referral to general surgery to excise the recurrent abscess of the right buttocks has been placed today.  If you have not heard from the specialty office or our referral coordinator within 1 week, please let us know and we will follow up with the referral coordinator for an update.  Can check into [employer] [facility] for assistance with getting the correct orthotics to help with your chronic foot/arch pain.  Be sure to pull your toes upwards and have your full arch exposed when you are trying to be fitted correctly.  Can put the ball of your foot on a step and lift yourself up and down to stretch out your arch.  Can roll your arches over frozen water bottles, which can help decrease the inflammation and help with stretching.  I have sent in a prescription for gabapentin to take 100mg  up to 3x per day as needed for foot pain.  I have also sent in a prescription for Cialis 10mg  to take daily, as needed for erectile dysfunction.  As we discussed, some patient have had benefit with taking 10mg  every other day.    Try to get exercise a minimum of 30 minutes per day at least 5 days per week as well as  adequate water intake all while measuring blood pressure a few times per week.  Keep a blood pressure log and bring back to clinic at your next visit.  If your readings are consistently over 130/80 to contact our office/send me a MyChart message and we will see you sooner.  Can try DASH and Mediterranean diet options, avoiding processed foods, lowering sodium intake, avoiding pork products, and eating a plant based diet for optimal health.  We will plan to see you back in 3 months for hypertension follow up visit  You will  receive a survey after today's visit either digitally by e-mail or paper by USPS mail. Your experiences and feedback matter to .  Please respond so we know how we are doing as we provide care for you.  Call with any questions/concerns/needs.  It is my goal to be available to you for your health concerns.  Thanks for choosing me to be a partner in your healthcare needs!  , FNP-C Family Nurse Practitioner [facility] Phone: [phone]

## 2020-06-01 NOTE — Assessment & Plan Note (Signed)
Discussed nephrology has sent referral for patient to urology for concerns of erectile dysfunction.  Has received message but has not yet called them to schedule.  Offered a trial of cialis 10mg  to take as needed for symptoms and if no improvement, to follow up with urology.  Patient in agreement with treatment plan.  Plan: 1. Begin cialis 10mg  as needed, but can try 10mg  every other day, for help with symptoms of erectile dysfunction 2. Follow up in 3 months, or sooner, should not achieve symptom improvement

## 2020-06-01 NOTE — Assessment & Plan Note (Signed)
Hidradenitis suppurativa, has followed with dermatology, has been using halobetasol 0.05% ointment as needed and has some flaring with recent right buttocks abscess that was lanced and drained with [facility] ER on 05/29/2020.  Reports this has been a recurrent area for abscess formation, requiring I&D.  Requesting referral to general surgery to have this excised so they do not reoccur.  Plan: 1. Continue halobetasol 0.05% as directed 2. Begin Doxycycline 100mg  BID daily 3. Referral to general surgery, per patient request, to have this area of right buttocks excised 4. Follow up in 3 months, if having worsening of symptoms to return to dermatology

## 2020-06-05 ENCOUNTER — Ambulatory Visit: Payer: 59 | Admitting: General Surgery

## 2020-06-12 ENCOUNTER — Telehealth: Payer: Self-pay | Admitting: Family Medicine

## 2020-06-12 NOTE — Telephone Encounter (Signed)
Attempted to contact the patient, no answer. LMOM that I was going to follow up with him by way of mychart. I also stated if the patient had any questions or concerns he could give me a call back.

## 2020-06-12 NOTE — Telephone Encounter (Signed)
Pt  Is requesting that you  Do NOT  Change his medication

## 2020-06-12 NOTE — Telephone Encounter (Signed)
He was taken off of the amlodipine by his nephrologist - that question needs to be deferred to them.  He requested to hold the clonidine due to feeling dizzy/lightheaded 1 hour after taking it at our last office visit.

## 2020-06-12 NOTE — Telephone Encounter (Signed)
Pt called stating that he was referring to the amlodipine and clonidine. Pt is requesting to not be taken of the medication because it seems to be working for him. Please advise .

## 2020-06-12 NOTE — Telephone Encounter (Signed)
What does this mean

## 2020-06-12 NOTE — Telephone Encounter (Signed)
Attempted to contact the patient, left message with the patient wife for him to call back to clarify what medication he is concern about.

## 2020-06-14 ENCOUNTER — Encounter: Payer: Self-pay | Admitting: Surgery

## 2020-06-14 ENCOUNTER — Other Ambulatory Visit: Payer: Self-pay

## 2020-06-14 ENCOUNTER — Ambulatory Visit: Payer: Self-pay | Admitting: General Practice

## 2020-06-14 ENCOUNTER — Ambulatory Visit (INDEPENDENT_AMBULATORY_CARE_PROVIDER_SITE_OTHER): Payer: 59 | Admitting: Surgery

## 2020-06-14 ENCOUNTER — Telehealth: Payer: Self-pay

## 2020-06-14 VITALS — BP 144/94 | HR 67 | Temp 98.1°F | Ht 72.0 in | Wt 310.8 lb

## 2020-06-14 DIAGNOSIS — L732 Hidradenitis suppurativa: Secondary | ICD-10-CM

## 2020-06-14 NOTE — Chronic Care Management (AMB) (Signed)
  Chronic Care Management   Outreach Note  06/14/2020 Name: Oscar Jones MRN: [id number] DOB: 10-Jun-1971  Referred by: Tarri Fuller, FNP Reason for referral : Care Coordination (Follow up: 3rd attempt-[facility] Chronic Disease Management and Care Coordination Needs )   Third unsuccessful telephone outreach was attempted today. The patient was referred to the case management team for assistance with care management and care coordination. The patient's primary care provider has been notified of our unsuccessful attempts to make or maintain contact with the patient. The care management team is pleased to engage with this patient at any time in the future should he/she be interested in assistance from the care management team.   Follow Up Plan: The care management team is available to follow up with the patient after provider conversation with the patient regarding recommendation for care management engagement and subsequent re-referral to the care management team.   Alto Denver RN, MSN, CCM Community Care Coordinator [facility]  [facility] [place] Mobile: [phone]

## 2020-06-14 NOTE — Patient Instructions (Addendum)
Dr.Rodenberg recommends patient to complete course of Doxycycline treatment.  Dr.Rodenberg advised patient if the area of concern worsen, do not touch it or manipulate with the area.  Patient advised to give our office if he notice the area or symptoms worsen.  Hidradenitis Suppurativa Hidradenitis suppurativa is a long-term (chronic) skin disease. It is similar to a severe form of acne, but it affects areas of the body where acne would be unusual, especially areas of the body where skin rubs against skin and becomes moist. These include:  Underarms.  Groin.  Genital area.  Buttocks.  Upper thighs.  Breasts. Hidradenitis suppurativa may start out as small lumps or pimples caused by blocked sweat glands or hair follicles. Pimples may develop into deep sores that break open (rupture) and drain pus. Over time, affected areas of skin may thicken and become scarred. This condition is rare and does not spread from person to person (non-contagious). What are the causes? The exact cause of this condition is not known. It may be related to:  Male and male hormones.  An overactive disease-fighting system (immune system). The immune system may over-react to blocked hair follicles or sweat glands and cause swelling and pus-filled sores. What increases the risk? You are more likely to develop this condition if you:  Are male.  Are 63-18 years old.  Have a family history of hidradenitis suppurativa.  Have a personal history of acne.  Are overweight.  Smoke.  Take the medicine lithium. What are the signs or symptoms? The first symptoms are usually painful bumps in the skin, similar to pimples. The condition may get worse over time (progress), or it may only cause mild symptoms. If the disease progresses, symptoms may include:  Skin bumps getting bigger and growing deeper into the skin.  Bumps rupturing and draining pus.  Itchy, infected skin.  Skin getting thicker and  scarred.  Tunnels under the skin (fistulas) where pus drains from a bump.  Pain during daily activities, such as pain during walking if your groin area is affected.  Emotional problems, such as stress or depression. This condition may affect your appearance and your ability or willingness to wear certain clothes or do certain activities. How is this diagnosed? This condition is diagnosed by a health care provider who specializes in skin diseases (dermatologist). You may be diagnosed based on:  Your symptoms and medical history.  A physical exam.  Testing a pus sample for infection.  Blood tests. How is this treated? Your treatment will depend on how severe your symptoms are. The same treatment will not work for everybody with this condition. You may need to try several treatments to find what works best for you. Treatment may include:  Cleaning and bandaging (dressing) your wounds as needed.  Lifestyle changes, such as new skin care routines.  Taking medicines, such as: ? Antibiotics. ? Acne medicines. ? Medicines to reduce the activity of the immune system. ? A diabetes medicine (metformin). ? Birth control pills, for women. ? Steroids to reduce swelling and pain.  Working with a mental health care provider, if you experience emotional distress due to this condition. If you have severe symptoms that do not get better with medicine, you may need surgery. Surgery may involve:  Using a laser to clear the skin and remove hair follicles.  Opening and draining deep sores.  Removing the areas of skin that are diseased and scarred. Follow these instructions at home: Medicines   Take over-the-counter and prescription medicines only  as told by your health care provider.  If you were prescribed an antibiotic medicine, take it as told by your health care provider. Do not stop taking the antibiotic even if your condition improves. Skin care  If you have open wounds, cover them  with a clean dressing as told by your health care provider. Keep wounds clean by washing them gently with soap and water when you bathe.  Do not shave the areas where you get hidradenitis suppurativa.  Do not wear deodorant.  Wear loose-fitting clothes.  Try to avoid getting overheated or sweaty. If you get sweaty or wet, change into clean, dry clothes as soon as you can.  To help relieve pain and itchiness, cover sore areas with a warm, clean washcloth (warm compress) for 5-10 minutes as often as needed.  If told by your health care provider, take a bleach bath twice a week: ? Fill your bathtub halfway with water. ? Pour in  cup of unscented household bleach. ? Soak in the tub for 5-10 minutes. ? Only soak from the neck down. Avoid water on your face and hair. ? Shower to rinse off the bleach from your skin. General instructions  Learn as much as you can about your disease so that you have an active role in your treatment. Work closely with your health care provider to find treatments that work for you.  If you are overweight, work with your health care provider to lose weight as recommended.  Do not use any products that contain nicotine or tobacco, such as cigarettes and e-cigarettes. If you need help quitting, ask your health care provider.  If you struggle with living with this condition, talk with your health care provider or work with a mental health care provider as recommended.  Keep all follow-up visits as told by your health care provider. This is important. Where to find more information  Hidradenitis Suppurativa Foundation, Inc.: https://www.hs-foundation.org/ Contact a health care provider if you have:  A flare-up of hidradenitis suppurativa.  A fever or chills.  Trouble controlling your symptoms at home.  Trouble doing your daily activities because of your symptoms.  Trouble dealing with emotional problems related to your condition. Summary  Hidradenitis  suppurativa is a long-term (chronic) skin disease. It is similar to a severe form of acne, but it affects areas of the body where acne would be unusual.  The first symptoms are usually painful bumps in the skin, similar to pimples. The condition may get worse over time (progress), or it may only cause mild symptoms.  If you have open wounds, cover them with a clean dressing as told by your health care provider. Keep wounds clean by washing them gently with soap and water when you bathe.  Besides skin care, treatment may include medicines, laser treatment, and surgery. This information is not intended to replace advice given to you by your health care provider. Make sure you discuss any questions you have with your health care provider. Document Revised: 12/02/2017 Document Reviewed: 12/02/2017 Elsevier Patient Education  2020 [employer].

## 2020-06-14 NOTE — Progress Notes (Signed)
Patient ID: Oscar Jones, male   DOB: 02-May-1971, 49 y.o.   MRN: [id number]  Chief Complaint: Recurring gluteal abscesses.  History of Present Illness Oscar Jones is a 49 y.o. 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  Years ago he underwent an excision with debridement for what sounds like a furuncle.  Years later he has infections that come and go, he often manipulates them on his own and obtain some relief with drainage.  He was concerned that these had developed in different areas and they might be moving through his system.  He is also had hip surgery.  On June 22 he had another boil develop and in the ER it was lanced and he was started on antibiotics.  He was concerned there may be a residual core or cystic lining of some type.  He has no history of nausea, vomiting, fevers or chills.  He has had no further drainage, and no lingering tenderness or inflammatory changes.  He has continued to take his doxycycline and complete his course.  He looks well and states he feels well at this time.  Past Medical History Past Medical History:  Diagnosis Date  . Anxiety   . Discoid lupus   . HLD (hyperlipidemia)   . Hypertension   . OSA (obstructive sleep apnea)       Past Surgical History:  Procedure Laterality Date  . JOINT REPLACEMENT      Allergies  Allergen Reactions  . Cyclobenzaprine Nausea Only  . Tramadol Nausea Only    Current Outpatient Medications  Medication Sig Dispense Refill  . acetaminophen (TYLENOL) 650 MG CR tablet Take 1,300 mg by mouth 3 (three) times a week.     Marland Kitchen atorvastatin (LIPITOR) 40 MG tablet TAKE 1 TABLET(40 MG) BY MOUTH DAILY AT 6 PM 90 tablet 1  . carvedilol (COREG) 6.25 MG tablet Take 1 tablet by mouth 2 (two) times daily.    . cloNIDine (CATAPRES) 0.1 MG tablet Take 1 tablet (0.1 mg total) by mouth 2 (two) times daily. 60 tablet 5  . doxycycline (VIBRA-TABS) 100 MG tablet Take 1 tablet (100 mg  total) by mouth 2 (two) times daily. 180 tablet 1  . gabapentin (NEURONTIN) 100 MG capsule Take 1 capsule (100 mg total) by mouth 3 (three) times daily. 90 capsule 3  . halobetasol (ULTRAVATE) 0.05 % ointment Apply twice a day to affected areas 50 g 0  . hydrALAZINE (APRESOLINE) 100 MG tablet Take 1 tablet (100 mg total) by mouth every 8 (eight) hours. 240 tablet 3  . isosorbide mononitrate (IMDUR) 30 MG 24 hr tablet TAKE 1 TABLET(30 MG) BY MOUTH DAILY 90 tablet 0  . levothyroxine (SYNTHROID) 175 MCG tablet TAKE 1 TABLET(175 MCG) BY MOUTH DAILY BEFORE BREAKFAST 90 tablet 0  . olmesartan (BENICAR) 40 MG tablet TAKE 1 TABLET(40 MG) BY MOUTH DAILY 90 tablet 1  . Vitamin D, Cholecalciferol, 50 MCG (2000 UT) CAPS Take 1 capsule by mouth daily.     No current facility-administered medications for this visit.    Family History Family History  Problem Relation Age of Onset  . Hypertension Mother   . Hypertension Father       Social History Social History   Tobacco Use  . Smoking status: Current Every Day Smoker    Packs/day: 0.50    Types: Cigarettes  . Smokeless tobacco: Never Used  Vaping Use  . Vaping Use: Never used  Substance  Use Topics  . Alcohol use: Yes    Alcohol/week: 2.0 standard drinks    Types: 2 Cans of beer per week    Comment: 2 x 32 oz cans weekend, pint vodka every 3 weeks  . Drug use: No        Review of Systems  Constitutional: Negative.   HENT: Negative.   Eyes: Negative.   Respiratory: Negative.   Cardiovascular: Negative.   Gastrointestinal: Negative.   Genitourinary: Negative.   Skin: Positive for itching and rash.  Neurological: Negative.   Psychiatric/Behavioral: Negative.   Above noted his chronic issues related to the patient's reported discoid lupus.    Physical Exam Blood pressure (!) 144/94, pulse 67, temperature 98.1 F (36.7 C), temperature source Temporal, height 6' (1.829 m), weight (!) 310 lb 12.8 oz (141 kg), SpO2 98 %. Last Weight   Most recent update: 06/14/2020 10:54 AM   Weight  141 kg (310 lb 12.8 oz)              CONSTITUTIONAL: Well developed, morbidly obese, and well nourished, appropriately responsive and aware without distress.   EYES: Sclera non-icteric.   EARS, NOSE, MOUTH AND THROAT: Mask worn.   Hearing is intact to voice.  NECK: Trachea is midline,  LYMPH NODES: No lymph node masses in the neck appreciated. RESPIRATORY:  Lungs are clear, and breath sounds are equal bilaterally. Normal respiratory effort without pathologic use of accessory muscles. CARDIOVASCULAR: Heart is regular in rate and rhythm. GI: The abdomen is soft, nontender, and nondistended. There were no palpable masses. I did not appreciate hepatosplenomegaly. There were normal bowel sounds.  MUSCULOSKELETAL:  Symmetrical muscle tone appreciated in all four extremities.    SKIN: Skin turgor is normal. No pathologic skin lesions appreciated.  There is a healing I&D site of the medial aspect of the right buttock.  There is no evidence of residual cystic lesion, induration or fluctuance.  But surprisingly I find very little of scarring, induration or skin changes elsewhere on the buttocks. NEUROLOGIC:  Motor and sensation appear grossly normal.  Cranial nerves are grossly without defect. PSYCH:  Alert and oriented to person, place and time. Affect is appropriate for situation.  Data Reviewed I have personally reviewed what is currently available of the patient's imaging, recent labs and medical records.   Labs:  CBC Latest Ref Rng & Units 02/06/2020 12/09/2017 12/08/2017  WBC 3.8 - 10.8 Thousand/uL 5.1 4.6 5.4  Hemoglobin 13.2 - 17.1 g/dL 16.6 06.3 01.6  Hematocrit 38 - 50 % 45.3 43.3 45.1  Platelets 140 - 400 Thousand/uL 172 141(L) 136(L)   CMP Latest Ref Rng & Units 02/06/2020 08/23/2019 01/31/2019  Glucose 65 - 99 mg/dL 96 91 81  BUN 7 - 25 mg/dL 20 15 16   Creatinine 0.70 - 1.33 mg/dL 0.10) 9.32(T)  Sodium 135 - 146 mmol/L 139 137 139    Potassium 3.5 - 5.3 mmol/L 4.3 4.0 4.4  Chloride 98 - 110 mmol/L 108 105 107  CO2 20 - 32 mmol/L 23 23 23   Calcium 8.6 - 10.3 mg/dL 9.4 9.3 9.1  Total Protein 6.1 - 8.1 g/dL 7.1 7.2 6.4  Total Bilirubin 0.2 - 1.2 mg/dL 0.4 0.4 0.4  Alkaline Phos 38 - 126 U/L - - -  AST 10 - 35 U/L 17 23 17   ALT 9 - 46 U/L 19 22 16       Imaging:  Within last 24 hrs: No results found.  Assessment    Recurrent gluteal  skin abscesses, uncertain as to degree of hidradenitis suppurativa.  Currently completing course of doxycycline following successful incision and drainage.  I do not appreciate any residual for excision. Patient Active Problem List   Diagnosis Date Noted  . Prediabetes 02/07/2020  . Vitamin D deficiency 02/07/2020  . Hidradenitis suppurativa 02/03/2020  . Erectile dysfunction 02/03/2020  . Chronic foot pain, right 11/23/2018  . Elevated rheumatoid factor 10/01/2018  . Encounter for long-term (current) use of high-risk medication 10/01/2018  . Other forms of systemic lupus erythematosus (HCC) 10/01/2018  . Dyspnea on exertion 09/17/2018  . Polyarthralgia 09/17/2018  . Accelerated hypertension 12/08/2017  . HLD (hyperlipidemia) 12/08/2017  . OSA (obstructive sleep apnea) 02/23/2017  . Hypertensive CKD (chronic kidney disease) 02/23/2017  . Other specified hypothyroidism 02/23/2017  . Discoid lupus 02/23/2017  . Essential hypertension 02/23/2017  . Obesity 02/23/2017    Plan    I advised he continue his course of antibiotics as prescribed.  There is no local wound care to address at present.  We'll be happy to assist him in future recurrences, hopefully avoiding any further ER visits. We discussed weight loss, watching his diet and the relationship of his immune system/lupus.  Face-to-face time spent with the patient and accompanying care providers(if present) was 30 minutes, with more than 50% of the time spent counseling, educating, and coordinating care of the patient.       Campbell Lerner M.D., FACS 06/14/2020, 11:19 AM

## 2020-06-18 ENCOUNTER — Ambulatory Visit: Payer: Self-pay | Admitting: Urology

## 2020-06-18 ENCOUNTER — Encounter: Payer: Self-pay | Admitting: Urology

## 2020-06-20 ENCOUNTER — Other Ambulatory Visit: Payer: Self-pay | Admitting: Family Medicine

## 2020-06-20 ENCOUNTER — Other Ambulatory Visit: Payer: Self-pay | Admitting: Nurse Practitioner

## 2020-06-20 DIAGNOSIS — I10 Essential (primary) hypertension: Secondary | ICD-10-CM

## 2020-06-20 DIAGNOSIS — E782 Mixed hyperlipidemia: Secondary | ICD-10-CM

## 2020-06-27 ENCOUNTER — Telehealth: Payer: Self-pay

## 2020-06-27 ENCOUNTER — Ambulatory Visit: Payer: Self-pay | Admitting: Pharmacist

## 2020-06-27 NOTE — Chronic Care Management (AMB) (Signed)
  Chronic Care Management   Follow Up Note   06/27/2020 Name: Oscar Jones MRN: [id number] DOB: [birth year]  Referred by: Tarri Fuller, FNP Reason for referral : Chronic Care Management (Patient Phone Call)   Oscar Jones is a 49 y.o. year old male who is a primary care patient of Anitra Lauth, Jodelle Gross, FNP. The CCM team was consulted for assistance with chronic disease management and care coordination needs.    Was unable to reach patient via telephone today and have left HIPAA compliant voicemail asking patient to return my call.  Plan  The care management team will reach out to the patient again over the next 30 days.   Duanne Moron, PharmD, [facility] Clinical Pharmacist [facility] [employer] [phone]

## 2020-07-07 ENCOUNTER — Encounter: Payer: Self-pay | Admitting: Family Medicine

## 2020-07-27 ENCOUNTER — Telehealth: Payer: Self-pay | Admitting: Pharmacist

## 2020-07-27 ENCOUNTER — Telehealth: Payer: Self-pay

## 2020-07-27 NOTE — Telephone Encounter (Signed)
°  Chronic Care Management   Outreach Note  07/27/2020 Name: Oscar Jones MRN: [id number] DOB: Sep 12, 1971  Referred by: Tarri Fuller, FNP Reason for referral : No chief complaint on file.   A second unsuccessful telephone outreach was attempted today. The patient was referred to the case management team for assistance with care management and care coordination.   Follow Up Plan: Will collaborate with Care Guide to outreach to schedule follow up with me  Duanne Moron, PharmD, [facility] Clinical Pharmacist [facility] Care Management [phone]

## 2020-07-31 ENCOUNTER — Telehealth: Payer: Self-pay

## 2020-07-31 NOTE — Chronic Care Management (AMB) (Signed)
  Care Management   Note  07/31/2020 Name: [name] MRN: [id number] DOB: 03-Jul-1971  Oscar Jones is a 48 y.o. year old male who is a primary care patient of Anitra Lauth, Jodelle Gross, FNP and is actively engaged with the care management team. I reached out to Gwenevere Abbot by phone today to assist with re-scheduling a follow up visit with the Pharmacist  Follow up plan: Unsuccessful telephone outreach attempt made. A HIPPA compliant phone message was left for the patient providing contact information and requesting a return call.  The care management team will reach out to the patient again over the next 7 days.  If patient returns call to provider office, please advise to call Embedded Care Management Care Guide Penne Lash  at [phone]  Penne Lash, RMA Care Guide, Embedded Care Coordination [facility]  [place], [place] [address] Direct Dial: [phone] [email or web address]: [email or web address]

## 2020-08-03 ENCOUNTER — Encounter: Payer: Self-pay | Admitting: Family Medicine

## 2020-08-03 ENCOUNTER — Other Ambulatory Visit: Payer: Self-pay

## 2020-08-03 ENCOUNTER — Ambulatory Visit (INDEPENDENT_AMBULATORY_CARE_PROVIDER_SITE_OTHER): Payer: 59 | Admitting: Family Medicine

## 2020-08-03 VITALS — BP 175/111 | HR 75 | Temp 98.2°F | Resp 18 | Ht 72.0 in | Wt 315.0 lb

## 2020-08-03 DIAGNOSIS — L732 Hidradenitis suppurativa: Secondary | ICD-10-CM

## 2020-08-03 DIAGNOSIS — R7303 Prediabetes: Secondary | ICD-10-CM | POA: Diagnosis not present

## 2020-08-03 DIAGNOSIS — I10 Essential (primary) hypertension: Secondary | ICD-10-CM

## 2020-08-03 LAB — POCT GLYCOSYLATED HEMOGLOBIN (HGB A1C): Hemoglobin A1C: 5.5 % (ref 4.0–5.6)

## 2020-08-03 MED ORDER — HALOBETASOL PROPIONATE 0.05 % EX OINT: TOPICAL_OINTMENT | CUTANEOUS | 0 refills | Status: AC

## 2020-08-03 NOTE — Patient Instructions (Signed)
A referral to cardiology for assistance with your hypertension has been placed today.  If you have not heard from the specialty office or our referral coordinator within 1 week, please let us know and we will follow up with the referral coordinator for an update.  Continue your medications as directed  Try to get exercise a minimum of 30 minutes per day at least 5 days per week as well as  adequate water intake all while measuring blood pressure a few times per week.  Keep a blood pressure log and bring back to clinic at your next visit.  If your readings are consistently over 130/80 to contact our office/send me a MyChart message and we will see you sooner.  Can try DASH and Mediterranean diet options, avoiding processed foods, lowering sodium intake, avoiding pork products, and eating a plant based diet for optimal health.  Education and discussion with patient regarding hypertension as well as the effects on the organs and body.  Specifically, we spoke about kidney disease, kidney failure, heart attack, stroke and up to and including death, as likely outcomes if non-compliant with blood pressure regulation.  Discussed how all of these habits are attached to each other and each has the effect on each other.  We will plan to see you back in 2 weeks for hypertension re-evaluation  You will receive a survey after today's visit either digitally by e-mail or paper by USPS mail. Your experiences and feedback matter to [place].  Please respond so we know how we are doing as we provide care for you.  Call us with any questions/concerns/needs.  It is my goal to be available to you for your health concerns.  Thanks for choosing me to be a partner in your healthcare needs!  Charlaine Dalton, FNP-C Family Nurse Practitioner [facility] Phone: [phone]

## 2020-08-03 NOTE — Progress Notes (Signed)
Subjective:    Patient ID: Oscar Jones, male    DOB: 1971-01-09, 49 y.o.   MRN: [id number]  Oscar Jones is a 49 y.o. male presenting on 08/03/2020 for Hypertension and Prediabetes   HPI   Hypertension - He is not checking BP at home or outside of clinic.    - Current medications: carvedilol 6.25mg  BID, hydralazine 100mg  TID, imdur 30mg  daily, olmesartan 40mg  daily, clonidine 0.1mg  BID PRN, tolerating well without side effects - He is not currently symptomatic. - Pt denies headache, lightheadedness, dizziness, changes in vision, chest tightness/pressure, palpitations, leg swelling, sudden loss of speech or loss of consciousness. - He  reports no regular exercise routine. - His diet is high in salt, high in fat, and high in carbohydrates.   Depression screen [facility] 2/9 02/13/2020 02/03/2020 12/02/2018  Decreased Interest 0 0 0  Down, Depressed, Hopeless 0 0 0  PHQ - 2 Score 0 0 0    Social History   Tobacco Use  . Smoking status: Current Every Day Smoker    Packs/day: 0.50    Types: Cigarettes  . Smokeless tobacco: Never Used  Vaping Use  . Vaping Use: Never used  Substance Use Topics  . Alcohol use: Yes    Alcohol/week: 2.0 standard drinks    Types: 2 Cans of beer per week    Comment: 2 x 32 oz cans weekend, pint vodka every 3 weeks  . Drug use: No    Review of Systems  Constitutional: Negative.   HENT: Negative.   Eyes: Negative.   Respiratory: Negative.   Cardiovascular: Negative.   Gastrointestinal: Negative.   Endocrine: Negative.   Genitourinary: Negative.   Musculoskeletal: Negative.   Skin: Negative.   Allergic/Immunologic: Negative.   Neurological: Negative.   Hematological: Negative.   Psychiatric/Behavioral: Negative.    Per HPI unless specifically indicated above     Objective:    BP (!) 175/111 (BP Location: Right Arm, Patient Position: Sitting, Cuff Size: Large)   Pulse 75   Temp 98.2 F (36.8 C) (Oral)   Resp 18   Ht 6' (1.829 m)   Wt  (!) 315 lb (142.9 kg)   SpO2 100%   BMI 42.72 kg/m   Wt Readings from Last 3 Encounters:  08/03/20 (!) 315 lb (142.9 kg)  06/14/20 (!) 310 lb 12.8 oz (141 kg)  06/01/20 (!) 308 lb 12.8 oz (140.1 kg)    Physical Exam Vitals reviewed.  Constitutional:      General: He is not in acute distress.    Appearance: Normal appearance. He is well-developed and well-groomed. He is morbidly obese. He is not ill-appearing or toxic-appearing.  HENT:     Head: Normocephalic and atraumatic.     Nose:     Comments: 08/05/20 is in place, covering mouth and nose. Eyes:     General:        Right eye: No discharge.        Left eye: No discharge.     Extraocular Movements: Extraocular movements intact.     Conjunctiva/sclera: Conjunctivae normal.     Pupils: Pupils are equal, round, and reactive to light.  Cardiovascular:     Rate and Rhythm: Normal rate and regular rhythm.     Pulses: Normal pulses.     Heart sounds: Normal heart sounds. No murmur heard.  No friction rub. No gallop.   Pulmonary:     Effort: Pulmonary effort is normal. No respiratory distress.     Breath sounds:  Normal breath sounds.  Musculoskeletal:     Right lower leg: No edema.     Left lower leg: No edema.  Skin:    General: Skin is warm and dry.     Capillary Refill: Capillary refill takes less than 2 seconds.  Neurological:     General: No focal deficit present.     Mental Status: He is alert and oriented to person, place, and time.     Cranial Nerves: No cranial nerve deficit.     Sensory: No sensory deficit.     Motor: No weakness or seizure activity.     Coordination: Coordination normal.     Gait: Gait normal.  Psychiatric:        Attention and Perception: Attention and perception normal.        Mood and Affect: Mood and affect normal.        Speech: Speech normal.        Behavior: Behavior normal. Behavior is cooperative.        Thought Content: Thought content normal.        Cognition and Memory: Cognition  and memory normal.    Results for orders placed or performed in visit on 08/03/20  POCT glycosylated hemoglobin (Hb A1C)  Result Value Ref Range   Hemoglobin A1C 5.5 4.0 - 5.6 %   HbA1c POC (<> result, manual entry)     HbA1c, POC (prediabetic range)     HbA1c, POC (controlled diabetic range)        Assessment & Plan:   Problem List Items Addressed This Visit      Cardiovascular and Mediastinum   Essential hypertension    Uncontrolled hypertension.  BP is not at goal < 130/80.  Pt is not working on lifestyle modifications.  Taking medications tolerating well without side effects.  Complications: HLD, Morbid obesity, Lupus  Plan: 1. Continue taking medications as prescribed 2. Referral placed to cardiology for assistance with BP control 3. Encouraged heart healthy diet and increasing exercise to 30 minutes most days of the week, going no more than 2 days in a row without exercise. 4. Check BP 1-2 x per week at home, keep log, and bring to clinic at next appointment. 5. Follow up 3 months.  6. With current BP in office, encouraged to proceed to ER for intervention to assist with lowering of blood pressure, patient declines.  Educated elevated BP at his level that it is in clinic can lead to stroke, heart attack, up to and including death.  Patient verbalized understanding and declined.        Relevant Orders   Ambulatory referral to Cardiology     Musculoskeletal and Integument   Hidradenitis suppurativa    Has been followed with dermatology and has declined return to specialist.  Has been using halobetasol 0.05% ointment as needed.  Refill requested.  Plan: 1. Refill on halobetasol sent to pharmacy      Relevant Medications   halobetasol (ULTRAVATE) 0.05 % ointment     Other   Prediabetes - Primary    A1C 5.5%, improved from 6.1% on 02/06/2020.  Encouraged dietary/lifestyle modifications      Relevant Orders   POCT glycosylated hemoglobin (Hb A1C) (Completed)        Meds ordered this encounter  Medications  . halobetasol (ULTRAVATE) 0.05 % ointment    Sig: Apply twice a day to affected areas    Dispense:  50 g    Refill:  0  Follow up plan: Return in about 2 weeks (around 08/17/2020) for HTN Follow up visit.   Charlaine Dalton, FNP Family Nurse Practitioner [facility] 08/03/2020, 5:15 PM

## 2020-08-06 NOTE — Assessment & Plan Note (Signed)
A1C 5.5%, improved from 6.1% on 02/06/2020.  Encouraged dietary/lifestyle modifications

## 2020-08-06 NOTE — Assessment & Plan Note (Signed)
Uncontrolled hypertension.  BP is not at goal < 130/80.  Pt is not working on lifestyle modifications.  Taking medications tolerating well without side effects.  Complications: HLD, Morbid obesity, Lupus  Plan: 1. Continue taking medications as prescribed 2. Referral placed to cardiology for assistance with BP control 3. Encouraged heart healthy diet and increasing exercise to 30 minutes most days of the week, going no more than 2 days in a row without exercise. 4. Check BP 1-2 x per week at home, keep log, and bring to clinic at next appointment. 5. Follow up 3 months.  6. With current BP in office, encouraged to proceed to ER for intervention to assist with lowering of blood pressure, patient declines.  Educated elevated BP at his level that it is in clinic can lead to stroke, heart attack, up to and including death.  Patient verbalized understanding and declined.

## 2020-08-06 NOTE — Telephone Encounter (Signed)
Pt has been r/s for 08/24/2020 °

## 2020-08-06 NOTE — Assessment & Plan Note (Signed)
Has been followed with dermatology and has declined return to specialist.  Has been using halobetasol 0.05% ointment as needed.  Refill requested.  Plan: 1. Refill on halobetasol sent to pharmacy

## 2020-08-06 NOTE — Chronic Care Management (AMB) (Signed)
  Care Management   Note  08/06/2020 Name: Silverio Hagan MRN: [id number] DOB: Apr 19, 1971  Nicholad Kautzman is a 49 y.o. year old male who is a primary care patient of Anitra Lauth, Jodelle Gross, FNP and is actively engaged with the care management team. I reached out to Gwenevere Abbot by phone today to assist with re-scheduling a follow up visit with the Pharmacist  Follow up plan: Telephone appointment with care management team member scheduled for:08/24/2020  Penne Lash, RMA Care Guide, Embedded Care Coordination [facility]  [place], [place] [address] Direct Dial: [phone] [email or web address]

## 2020-08-17 ENCOUNTER — Telehealth: Payer: 59 | Admitting: Family Medicine

## 2020-08-19 ENCOUNTER — Other Ambulatory Visit: Payer: Self-pay | Admitting: Family Medicine

## 2020-08-19 DIAGNOSIS — I10 Essential (primary) hypertension: Secondary | ICD-10-CM

## 2020-08-19 NOTE — Telephone Encounter (Signed)
Requested Prescriptions  Pending Prescriptions Disp Refills   isosorbide mononitrate (IMDUR) 30 MG 24 hr tablet [Pharmacy Med Name: ISOSORBIDE MONONITRATE 30MG  ER TABS] 90 tablet 4    Sig: TAKE 1 TABLET(30 MG) BY MOUTH DAILY     Cardiovascular:  Nitrates Failed - 08/19/2020  9:15 AM      Failed - Last BP in normal range    BP Readings from Last 1 Encounters:  08/03/20 (!) 175/111         Passed - Last Heart Rate in normal range    Pulse Readings from Last 1 Encounters:  08/03/20 75         Passed - Valid encounter within last 12 months    Recent Outpatient Visits          2 weeks ago Prediabetes   [facility], [facility], FNP   2 months ago Chronic foot pain, right   [facility], [facility], FNP   5 months ago OSA (obstructive sleep apnea)   [facility], [facility], FNP   6 months ago Essential hypertension   [facility], [facility], FNP   9 months ago Essential hypertension   [facility], [place], [place]

## 2020-08-24 ENCOUNTER — Telehealth: Payer: Self-pay | Admitting: Pharmacist

## 2020-08-24 ENCOUNTER — Telehealth: Payer: 59

## 2020-08-24 NOTE — Telephone Encounter (Signed)
Most certainly will ask.  He and his wife have relocated to [place], [place] and last I heard from the wife they are trying to find a local PCP to establish with.

## 2020-08-24 NOTE — Telephone Encounter (Signed)
°  Chronic Care Management   Outreach Note  08/24/2020 Name: Cheo Selvey MRN: [id number] DOB: 21-Nov-1971  Referred by: Tarri Fuller, FNP Reason for referral : No chief complaint on file.  Third unsuccessful telephone outreach was attempted today. The patient was referred to the case management team for assistance with care management and care coordination.      Follow Up Plan: The patient's primary care provider has been notified of our unsuccessful attempts to make or maintain contact with the patient. The care management team is pleased to engage with this patient at any time in the future should he/she be interested in assistance from the care management team.  Duanne Moron, PharmD, [facility] Clinical Pharmacist [facility] Care Management [phone]

## 2020-09-13 ENCOUNTER — Encounter: Payer: Self-pay | Admitting: Family Medicine

## 2020-09-14 ENCOUNTER — Encounter: Payer: Self-pay | Admitting: Family Medicine

## 2020-09-17 ENCOUNTER — Other Ambulatory Visit: Payer: Self-pay | Admitting: Family Medicine

## 2020-09-17 DIAGNOSIS — E782 Mixed hyperlipidemia: Secondary | ICD-10-CM

## 2020-09-17 DIAGNOSIS — I10 Essential (primary) hypertension: Secondary | ICD-10-CM

## 2020-09-17 DIAGNOSIS — E890 Postprocedural hypothyroidism: Secondary | ICD-10-CM

## 2020-09-17 MED ORDER — ATORVASTATIN CALCIUM 40 MG PO TABS: ORAL_TABLET | ORAL | 0 refills | Status: AC

## 2020-09-17 MED ORDER — LEVOTHYROXINE SODIUM 175 MCG PO TABS: ORAL_TABLET | ORAL | 0 refills | Status: AC

## 2020-09-17 MED ORDER — HYDRALAZINE HCL 100 MG PO TABS: 100.0000 mg | ORAL_TABLET | Freq: Three times a day (TID) | ORAL | 0 refills | Status: AC

## 2020-09-17 MED ORDER — OLMESARTAN MEDOXOMIL 40 MG PO TABS: ORAL_TABLET | ORAL | 0 refills | Status: AC

## 2020-09-17 MED ORDER — ISOSORBIDE MONONITRATE ER 30 MG PO TB24: ORAL_TABLET | ORAL | 0 refills | Status: AC

## 2020-11-05 ENCOUNTER — Encounter: Payer: Self-pay | Admitting: Family Medicine

## 2020-11-24 ENCOUNTER — Other Ambulatory Visit: Payer: Self-pay | Admitting: Nurse Practitioner

## 2020-11-24 DIAGNOSIS — I10 Essential (primary) hypertension: Secondary | ICD-10-CM

## 2021-09-12 ENCOUNTER — Encounter: Payer: Self-pay | Admitting: General Surgery

## 2022-01-15 ENCOUNTER — Other Ambulatory Visit: Payer: Self-pay

## 2022-01-15 DIAGNOSIS — I10 Essential (primary) hypertension: Secondary | ICD-10-CM
